# Patient Record
Sex: Male | Born: 1935 | Race: White | Hispanic: No | Smoking: Former smoker
Health system: Southern US, Community
[De-identification: ages and names within clinical notes are randomized; demographics above are authoritative.]

## PROBLEM LIST (undated history)

## (undated) DIAGNOSIS — D320 Benign neoplasm of cerebral meninges: Secondary | ICD-10-CM

## (undated) DIAGNOSIS — M1990 Unspecified osteoarthritis, unspecified site: Secondary | ICD-10-CM

## (undated) DIAGNOSIS — C679 Malignant neoplasm of bladder, unspecified: Secondary | ICD-10-CM

## (undated) DIAGNOSIS — J449 Chronic obstructive pulmonary disease, unspecified: Secondary | ICD-10-CM

## (undated) DIAGNOSIS — C61 Malignant neoplasm of prostate: Secondary | ICD-10-CM

## (undated) DIAGNOSIS — I10 Essential (primary) hypertension: Secondary | ICD-10-CM

## (undated) DIAGNOSIS — E782 Mixed hyperlipidemia: Secondary | ICD-10-CM

## (undated) DIAGNOSIS — G4730 Sleep apnea, unspecified: Secondary | ICD-10-CM

## (undated) DIAGNOSIS — D649 Anemia, unspecified: Secondary | ICD-10-CM

## (undated) DIAGNOSIS — K219 Gastro-esophageal reflux disease without esophagitis: Secondary | ICD-10-CM

## (undated) DIAGNOSIS — Z8701 Personal history of pneumonia (recurrent): Secondary | ICD-10-CM

## (undated) DIAGNOSIS — Z8709 Personal history of other diseases of the respiratory system: Secondary | ICD-10-CM

## (undated) DIAGNOSIS — K449 Diaphragmatic hernia without obstruction or gangrene: Secondary | ICD-10-CM

## (undated) DIAGNOSIS — N200 Calculus of kidney: Secondary | ICD-10-CM

## (undated) DIAGNOSIS — I2510 Atherosclerotic heart disease of native coronary artery without angina pectoris: Secondary | ICD-10-CM

## (undated) HISTORY — DX: Anemia, unspecified: D64.9

## (undated) HISTORY — PX: KNEE SURGERY: SHX244

## (undated) HISTORY — DX: Malignant neoplasm of prostate: C61

## (undated) HISTORY — DX: Personal history of other diseases of the respiratory system: Z87.09

## (undated) HISTORY — DX: Atherosclerotic heart disease of native coronary artery without angina pectoris: I25.10

## (undated) HISTORY — DX: Essential (primary) hypertension: I10

## (undated) HISTORY — DX: Mixed hyperlipidemia: E78.2

## (undated) HISTORY — DX: Calculus of kidney: N20.0

## (undated) HISTORY — DX: Diaphragmatic hernia without obstruction or gangrene: K44.9

## (undated) HISTORY — DX: Personal history of pneumonia (recurrent): Z87.01

---

## 2004-08-03 ENCOUNTER — Ambulatory Visit: Payer: Self-pay | Admitting: Cardiology

## 2006-03-07 ENCOUNTER — Ambulatory Visit: Payer: Self-pay | Admitting: Cardiology

## 2006-08-10 ENCOUNTER — Ambulatory Visit (HOSPITAL_COMMUNITY): Admission: RE | Admit: 2006-08-10 | Discharge: 2006-08-10 | Payer: Self-pay | Admitting: Urology

## 2006-08-18 ENCOUNTER — Ambulatory Visit (HOSPITAL_COMMUNITY): Admission: RE | Admit: 2006-08-18 | Discharge: 2006-08-18 | Payer: Self-pay | Admitting: Urology

## 2008-06-28 HISTORY — PX: PROSTATECTOMY: SHX69

## 2008-10-22 ENCOUNTER — Ambulatory Visit: Payer: Self-pay | Admitting: Cardiology

## 2008-12-26 ENCOUNTER — Ambulatory Visit: Admission: RE | Admit: 2008-12-26 | Discharge: 2009-03-24 | Payer: Self-pay | Admitting: Radiation Oncology

## 2010-01-07 ENCOUNTER — Ambulatory Visit: Payer: Self-pay | Admitting: Cardiology

## 2010-06-28 HISTORY — PX: KIDNEY STONE SURGERY: SHX686

## 2010-07-29 HISTORY — PX: CRANIOTOMY FOR STEREOTACTIC GUIDED SURGERY: SUR339

## 2010-08-18 ENCOUNTER — Other Ambulatory Visit (HOSPITAL_COMMUNITY): Payer: Self-pay | Admitting: Neurosurgery

## 2010-08-18 DIAGNOSIS — D320 Benign neoplasm of cerebral meninges: Secondary | ICD-10-CM

## 2010-08-21 ENCOUNTER — Ambulatory Visit (HOSPITAL_COMMUNITY)
Admission: RE | Admit: 2010-08-21 | Discharge: 2010-08-21 | Disposition: A | Discharge status: Home / Self Care | Payer: Medicare Other | Source: Ambulatory Visit | Attending: Neurosurgery | Admitting: Neurosurgery

## 2010-08-21 ENCOUNTER — Encounter (HOSPITAL_COMMUNITY)
Admission: RE | Admit: 2010-08-21 | Discharge: 2010-08-21 | Disposition: A | Discharge status: Home / Self Care | Payer: Medicare Other | Source: Ambulatory Visit | Attending: Neurosurgery | Admitting: Neurosurgery

## 2010-08-21 ENCOUNTER — Other Ambulatory Visit (HOSPITAL_COMMUNITY): Payer: Self-pay | Admitting: Neurosurgery

## 2010-08-21 DIAGNOSIS — Z01812 Encounter for preprocedural laboratory examination: Secondary | ICD-10-CM | POA: Insufficient documentation

## 2010-08-21 DIAGNOSIS — Z01811 Encounter for preprocedural respiratory examination: Secondary | ICD-10-CM

## 2010-08-21 DIAGNOSIS — Z77090 Contact with and (suspected) exposure to asbestos: Secondary | ICD-10-CM | POA: Insufficient documentation

## 2010-08-21 DIAGNOSIS — D496 Neoplasm of unspecified behavior of brain: Secondary | ICD-10-CM | POA: Insufficient documentation

## 2010-08-21 DIAGNOSIS — Z01818 Encounter for other preprocedural examination: Secondary | ICD-10-CM | POA: Insufficient documentation

## 2010-08-21 DIAGNOSIS — Z87891 Personal history of nicotine dependence: Secondary | ICD-10-CM | POA: Insufficient documentation

## 2010-08-21 DIAGNOSIS — Z01810 Encounter for preprocedural cardiovascular examination: Secondary | ICD-10-CM | POA: Insufficient documentation

## 2010-08-21 LAB — SURGICAL PCR SCREEN: MRSA, PCR: NEGATIVE

## 2010-08-21 LAB — BASIC METABOLIC PANEL
BUN: 19 mg/dL (ref 6–23)
Calcium: 9.7 mg/dL (ref 8.4–10.5)
Chloride: 103 mEq/L (ref 96–112)
Creatinine, Ser: 1.23 mg/dL (ref 0.4–1.5)
Glucose, Bld: 100 mg/dL — ABNORMAL HIGH (ref 70–99)
Potassium: 5 mEq/L (ref 3.5–5.1)

## 2010-08-21 LAB — TYPE AND SCREEN: Antibody Screen: NEGATIVE

## 2010-08-21 LAB — CBC
HCT: 40.5 % (ref 39.0–52.0)
RDW: 14.1 % (ref 11.5–15.5)

## 2010-08-21 LAB — ABO/RH: ABO/RH(D): A POS

## 2010-08-24 ENCOUNTER — Encounter (HOSPITAL_COMMUNITY): Payer: Self-pay

## 2010-08-24 ENCOUNTER — Ambulatory Visit (HOSPITAL_COMMUNITY)
Admission: RE | Admit: 2010-08-24 | Discharge: 2010-08-24 | Disposition: A | Discharge status: Home / Self Care | Payer: Medicare Other | Source: Ambulatory Visit | Attending: Neurosurgery | Admitting: Neurosurgery

## 2010-08-24 DIAGNOSIS — R51 Headache: Secondary | ICD-10-CM | POA: Insufficient documentation

## 2010-08-24 DIAGNOSIS — Z01818 Encounter for other preprocedural examination: Secondary | ICD-10-CM | POA: Insufficient documentation

## 2010-08-24 DIAGNOSIS — D320 Benign neoplasm of cerebral meninges: Secondary | ICD-10-CM | POA: Insufficient documentation

## 2010-08-24 DIAGNOSIS — R42 Dizziness and giddiness: Secondary | ICD-10-CM | POA: Insufficient documentation

## 2010-08-24 HISTORY — DX: Chronic obstructive pulmonary disease, unspecified: J44.9

## 2010-08-24 HISTORY — DX: Essential (primary) hypertension: I10

## 2010-08-24 MED ORDER — IOHEXOL 300 MG/ML  SOLN
100.0000 mL | Freq: Once | INTRAMUSCULAR | Status: AC | PRN
  Administered 2010-08-24: 100 mL via INTRAVENOUS

## 2010-08-26 ENCOUNTER — Inpatient Hospital Stay (HOSPITAL_COMMUNITY)
Admission: RE | Admit: 2010-08-26 | Discharge: 2010-08-27 | DRG: 027 | Disposition: A | Discharge status: Home / Self Care | Payer: Medicare Other | Source: Ambulatory Visit | Attending: Neurosurgery | Admitting: Neurosurgery

## 2010-08-26 ENCOUNTER — Other Ambulatory Visit: Payer: Self-pay | Admitting: Neurosurgery

## 2010-08-26 DIAGNOSIS — D320 Benign neoplasm of cerebral meninges: Principal | ICD-10-CM | POA: Diagnosis present

## 2010-08-26 DIAGNOSIS — Z880 Allergy status to penicillin: Secondary | ICD-10-CM

## 2010-09-17 NOTE — Op Note (Signed)
  NAME:  Harold York, Harold York              ACCOUNT NO.:  [id number]  MEDICAL RECORD NO.:  [id number]           PATIENT TYPE:  I  LOCATION:  3105                         FACILITY:  [facility]  PHYSICIAN:  Coletta Memos, M.D.     DATE OF BIRTH:  28-Feb-1936  DATE OF PROCEDURE:  08/26/2010 DATE OF DISCHARGE:                              OPERATIVE REPORT   PREOPERATIVE DIAGNOSIS:  Right frontoparietal meningioma.  POSTOPERATIVE DIAGNOSIS:  Right frontal meningioma.  COMPLICATIONS:  None.  SURGEON:  Coletta Memos, MD  ASSISTANT:  Clydene Fake, MD  PROCEDURE:  Right stereotactic-guided craniotomy for meningioma resection, frontoparietal.  COMPLICATIONS:  None.  ANESTHESIA:  General endotracheal.  INDICATIONS:  [name] is a 76 year old with very small meningioma.  He has some growth over followup scans, therefore he wanted this removed.  I agreed to remove the tumor.  OPERATIVE NOTE:  [name] had undergone a sterile CT scan.  After his coordinate and planning had been done on the StealthStation, he was taken to the operating room.  He was intubated and placed under general anesthetic without difficulty.  A Foley catheter was placed under sterile conditions.  Arterial line was placed also under sterile conditions.  His head was fixated after adequate anesthesia in a Mayfield three pin head holder to approximately 70 pounds of pressure. His head was then shaved and I then localized his head to the Stealth system.  After localization and planning of the procedure, his head was then prepped and draped in a sterile fashion.  I again used the Stealth to plan my scalp incision.  I opened the incision, which was semicircular, so that I can keep it behind the hairline and I reflected that anteriorly.  I then split just the very top portion of the temporalis muscle.  I placed 2 bur holes in the same coronal plane, one more medially and the other more lateral.  I connected the bur  holes with craniotome.  I elevated the skull flap.  I then again with the Stealth probe used that to make sure I was over the correct location and I was.  I then opened the dura with Metzenbaum scissors.  After cutting the dura, I was able to retract that and tumor was readily visible.  I then removed what was this very small tumor and surrounding meninges.  I then placed a dural patch graft using Bovine pericardium.  The dura and tumor was sent to Pathology for identification.  I then with Dr. Doreen Beam assistance replaced the skull flap.  We then reapproximated the scalp using Vicryl sutures to reapproximate the temporalis fascia and then the subgaleal layer.  I used staples for the scalp edges.  I placed a sterile dressing and removed them from the three pin Mayfield head holder.  He tolerated the procedure well.          ______________________________ Coletta Memos, M.D.     KC/MEDQ  D:  08/26/2010  T:  08/27/2010  Job:  [id number]  Electronically Signed by Coletta Memos M.D. on 09/17/2010 02:32:46 PM

## 2010-09-17 NOTE — Discharge Summary (Signed)
  NAME:  Harold York, Harold York              ACCOUNT NO.:  [id number]  MEDICAL RECORD NO.:  [id number]           PATIENT TYPE:  I  LOCATION:  3105                         FACILITY:  [facility]  PHYSICIAN:  Coletta Memos, M.D.     DATE OF BIRTH:  16-May-1936  DATE OF ADMISSION:  08/26/2010 DATE OF DISCHARGE:  08/27/2010                              DISCHARGE SUMMARY   ADMITTING DIAGNOSIS:  Right frontal meningioma.  DISCHARGE DIAGNOSIS:  Right frontal meningioma.  PROCEDURE:  Right frontal craniotomy for meningioma resection.  COMPLICATIONS:  None.  Craniotomy was done with stereotactic guidance.  DISCHARGE STATUS:  Alive and well.  DESTINATION:  Home.  MEDICATIONS:  Only new medication will be Vicodin for pain.  The patient is alert, oriented x4 and answering all questions appropriately at discharge.  Pupils are equal, round, and reactive to light.  Dressing is clean and dry.  He is alert and oriented x4.  Speech is clear and 5/5 strength in upper and lower extremities.  No drift.  He will be discharged home today.  Return appointment to see him in approximately 1 week for staple removal.          ______________________________ Coletta Memos, M.D.     KC/MEDQ  D:  08/27/2010  T:  08/28/2010  Job:  [id number]  Electronically Signed by Coletta Memos M.D. on 09/17/2010 02:32:39 PM

## 2010-11-13 NOTE — H&P (Signed)
NAME:  Harold York, Harold York              ACCOUNT NO.:  [id number]   MEDICAL RECORD NO.:  [id number]           PATIENT TYPE:  AMB   LOCATION:                                FACILITY:  [facility]   PHYSICIAN:  Ky Barban, M.D.DATE OF BIRTH:  03-Jul-1935   DATE OF ADMISSION:  DATE OF DISCHARGE:  LH                              HISTORY & PHYSICAL   CHIEF COMPLAINT:  Left renal calculus.   HISTORY:  This 75 year old gentleman was admitted to [facility]  on July 27, 2006, with left renal colic.  A workup showed that he has  a stone 5 mm in size, in the left ureteropelvic junction, so he  underwent surgery with a double-J stent.  He is being brought as an  outpatient to undergo a ESL.  The procedure or need for additional  procedure were discussed.  He understands and wants me to go ahead and  proceed.   PAST MEDICAL HISTORY:  1. To years ago he had a transurethral resection of the prostate for      benign prostatic hypertrophy, but he was subsequently found to have      Stage A, low-grade Gleason score 2 adenocarcinoma of the prostate.      It was advised simply observation.  2. He has severe chronic obstructive pulmonary disease.  3. 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  4. He underwent a __________ bladder neck.  Again this time I find      that he has bladder neck stenosis with fibrosis and calcium deposit      on the bladder neck.  I had to dissect again this time.   ALLERGIES:  PENICILLIN AND LIDOCAINE.   REVIEW OF SYSTEMS:  Denies chest pain, orthopnea, PND, nausea or  vomiting.   SOCIAL HISTORY:  He does not smoke or drink.   REVIEW OF SYSTEMS:  As mentioned above.   PHYSICAL EXAMINATION:  GENERAL:  Moderately obese, not in acute  distress.  VITAL SIGNS:  Blood pressure 130/80, temperature normal.  CNS:  Negative.  HEENT/NECK:  Negative.  CHEST:  Clear.  HEART:  Regular sinus rhythm.  No murmur.  ABDOMEN:   Soft, flat.  Liver and spleen and kidneys are not palpable.  GENITOURINARY:  External genitalia:  Circumcised meatus.  Testicles are  normal.  RECTAL:  Normal sphincter tone.  No rectal mass.  Prostate 1.5+, smooth  and firm.   IMPRESSION:  1. Left renal calculus.  2. Chronic obstructive pulmonary disease.  3. Stage A prostate cancer.   PLAN:  ESL for left renal calculus.      Ky Barban, M.D.  Electronically Signed     MIJ/MEDQ  D:  08/09/2006  T:  08/09/2006  Job:  [id number]

## 2011-06-29 HISTORY — PX: CATARACT EXTRACTION W/ INTRAOCULAR LENS  IMPLANT, BILATERAL: SHX1307

## 2011-07-11 DIAGNOSIS — N200 Calculus of kidney: Secondary | ICD-10-CM | POA: Diagnosis not present

## 2011-07-11 DIAGNOSIS — R319 Hematuria, unspecified: Secondary | ICD-10-CM | POA: Diagnosis not present

## 2011-07-11 DIAGNOSIS — R339 Retention of urine, unspecified: Secondary | ICD-10-CM | POA: Diagnosis not present

## 2011-07-11 DIAGNOSIS — Z8546 Personal history of malignant neoplasm of prostate: Secondary | ICD-10-CM | POA: Diagnosis not present

## 2011-07-11 DIAGNOSIS — Z79899 Other long term (current) drug therapy: Secondary | ICD-10-CM | POA: Diagnosis not present

## 2011-07-11 DIAGNOSIS — K7689 Other specified diseases of liver: Secondary | ICD-10-CM | POA: Diagnosis not present

## 2011-07-11 DIAGNOSIS — Z7982 Long term (current) use of aspirin: Secondary | ICD-10-CM | POA: Diagnosis not present

## 2011-07-11 DIAGNOSIS — R160 Hepatomegaly, not elsewhere classified: Secondary | ICD-10-CM | POA: Diagnosis not present

## 2011-07-12 DIAGNOSIS — R319 Hematuria, unspecified: Secondary | ICD-10-CM | POA: Diagnosis not present

## 2011-07-12 DIAGNOSIS — C61 Malignant neoplasm of prostate: Secondary | ICD-10-CM | POA: Diagnosis not present

## 2011-07-12 DIAGNOSIS — R6889 Other general symptoms and signs: Secondary | ICD-10-CM | POA: Diagnosis not present

## 2011-07-12 DIAGNOSIS — N390 Urinary tract infection, site not specified: Secondary | ICD-10-CM | POA: Diagnosis not present

## 2011-07-13 DIAGNOSIS — C61 Malignant neoplasm of prostate: Secondary | ICD-10-CM | POA: Diagnosis not present

## 2011-07-13 DIAGNOSIS — R310 Gross hematuria: Secondary | ICD-10-CM | POA: Diagnosis not present

## 2011-07-15 DIAGNOSIS — J438 Other emphysema: Secondary | ICD-10-CM | POA: Diagnosis not present

## 2011-07-15 DIAGNOSIS — N200 Calculus of kidney: Secondary | ICD-10-CM | POA: Diagnosis not present

## 2011-07-15 DIAGNOSIS — Q618 Other cystic kidney diseases: Secondary | ICD-10-CM | POA: Diagnosis not present

## 2011-07-15 DIAGNOSIS — N281 Cyst of kidney, acquired: Secondary | ICD-10-CM | POA: Diagnosis not present

## 2011-07-15 DIAGNOSIS — I10 Essential (primary) hypertension: Secondary | ICD-10-CM | POA: Diagnosis not present

## 2011-07-15 DIAGNOSIS — E782 Mixed hyperlipidemia: Secondary | ICD-10-CM | POA: Diagnosis not present

## 2011-07-15 DIAGNOSIS — R310 Gross hematuria: Secondary | ICD-10-CM | POA: Diagnosis not present

## 2011-07-15 DIAGNOSIS — Z8546 Personal history of malignant neoplasm of prostate: Secondary | ICD-10-CM | POA: Diagnosis not present

## 2011-07-15 DIAGNOSIS — M159 Polyosteoarthritis, unspecified: Secondary | ICD-10-CM | POA: Diagnosis not present

## 2011-07-15 DIAGNOSIS — C61 Malignant neoplasm of prostate: Secondary | ICD-10-CM | POA: Diagnosis not present

## 2011-07-27 DIAGNOSIS — C61 Malignant neoplasm of prostate: Secondary | ICD-10-CM | POA: Diagnosis not present

## 2011-09-21 DIAGNOSIS — C61 Malignant neoplasm of prostate: Secondary | ICD-10-CM | POA: Diagnosis not present

## 2011-09-27 DIAGNOSIS — C61 Malignant neoplasm of prostate: Secondary | ICD-10-CM | POA: Diagnosis not present

## 2011-10-21 DIAGNOSIS — I10 Essential (primary) hypertension: Secondary | ICD-10-CM | POA: Diagnosis not present

## 2012-01-20 DIAGNOSIS — I10 Essential (primary) hypertension: Secondary | ICD-10-CM | POA: Diagnosis not present

## 2012-01-20 DIAGNOSIS — E782 Mixed hyperlipidemia: Secondary | ICD-10-CM | POA: Diagnosis not present

## 2012-02-05 DIAGNOSIS — Z86011 Personal history of benign neoplasm of the brain: Secondary | ICD-10-CM | POA: Diagnosis not present

## 2012-02-05 DIAGNOSIS — Z8546 Personal history of malignant neoplasm of prostate: Secondary | ICD-10-CM | POA: Diagnosis not present

## 2012-02-05 DIAGNOSIS — R109 Unspecified abdominal pain: Secondary | ICD-10-CM | POA: Diagnosis not present

## 2012-02-05 DIAGNOSIS — R339 Retention of urine, unspecified: Secondary | ICD-10-CM | POA: Diagnosis not present

## 2012-02-05 DIAGNOSIS — Z79899 Other long term (current) drug therapy: Secondary | ICD-10-CM | POA: Diagnosis not present

## 2012-02-05 DIAGNOSIS — R319 Hematuria, unspecified: Secondary | ICD-10-CM | POA: Diagnosis not present

## 2012-02-05 DIAGNOSIS — Z7982 Long term (current) use of aspirin: Secondary | ICD-10-CM | POA: Diagnosis not present

## 2012-02-07 DIAGNOSIS — Z79899 Other long term (current) drug therapy: Secondary | ICD-10-CM | POA: Diagnosis not present

## 2012-02-07 DIAGNOSIS — R339 Retention of urine, unspecified: Secondary | ICD-10-CM | POA: Diagnosis not present

## 2012-02-07 DIAGNOSIS — T83091A Other mechanical complication of indwelling urethral catheter, initial encounter: Secondary | ICD-10-CM | POA: Diagnosis not present

## 2012-02-07 DIAGNOSIS — Z466 Encounter for fitting and adjustment of urinary device: Secondary | ICD-10-CM | POA: Diagnosis not present

## 2012-02-07 DIAGNOSIS — Z8546 Personal history of malignant neoplasm of prostate: Secondary | ICD-10-CM | POA: Diagnosis not present

## 2012-02-08 DIAGNOSIS — R310 Gross hematuria: Secondary | ICD-10-CM | POA: Diagnosis not present

## 2012-02-14 DIAGNOSIS — R310 Gross hematuria: Secondary | ICD-10-CM | POA: Diagnosis not present

## 2012-02-14 DIAGNOSIS — R319 Hematuria, unspecified: Secondary | ICD-10-CM | POA: Diagnosis not present

## 2012-02-14 DIAGNOSIS — N281 Cyst of kidney, acquired: Secondary | ICD-10-CM | POA: Diagnosis not present

## 2012-02-17 DIAGNOSIS — D494 Neoplasm of unspecified behavior of bladder: Secondary | ICD-10-CM | POA: Diagnosis not present

## 2012-02-18 DIAGNOSIS — R339 Retention of urine, unspecified: Secondary | ICD-10-CM | POA: Diagnosis not present

## 2012-02-18 DIAGNOSIS — R109 Unspecified abdominal pain: Secondary | ICD-10-CM | POA: Diagnosis not present

## 2012-02-18 DIAGNOSIS — Z79899 Other long term (current) drug therapy: Secondary | ICD-10-CM | POA: Diagnosis not present

## 2012-02-18 DIAGNOSIS — M549 Dorsalgia, unspecified: Secondary | ICD-10-CM | POA: Diagnosis not present

## 2012-02-18 DIAGNOSIS — Z8546 Personal history of malignant neoplasm of prostate: Secondary | ICD-10-CM | POA: Diagnosis not present

## 2012-02-24 DIAGNOSIS — E785 Hyperlipidemia, unspecified: Secondary | ICD-10-CM | POA: Diagnosis not present

## 2012-02-24 DIAGNOSIS — J449 Chronic obstructive pulmonary disease, unspecified: Secondary | ICD-10-CM | POA: Diagnosis not present

## 2012-02-24 DIAGNOSIS — I10 Essential (primary) hypertension: Secondary | ICD-10-CM | POA: Diagnosis not present

## 2012-02-24 DIAGNOSIS — C61 Malignant neoplasm of prostate: Secondary | ICD-10-CM | POA: Diagnosis not present

## 2012-02-24 DIAGNOSIS — C674 Malignant neoplasm of posterior wall of bladder: Secondary | ICD-10-CM | POA: Diagnosis not present

## 2012-02-24 DIAGNOSIS — C801 Malignant (primary) neoplasm, unspecified: Secondary | ICD-10-CM | POA: Diagnosis not present

## 2012-02-24 DIAGNOSIS — Z79899 Other long term (current) drug therapy: Secondary | ICD-10-CM | POA: Diagnosis not present

## 2012-02-24 DIAGNOSIS — Z801 Family history of malignant neoplasm of trachea, bronchus and lung: Secondary | ICD-10-CM | POA: Diagnosis not present

## 2012-02-24 DIAGNOSIS — K449 Diaphragmatic hernia without obstruction or gangrene: Secondary | ICD-10-CM | POA: Diagnosis not present

## 2012-02-24 DIAGNOSIS — D494 Neoplasm of unspecified behavior of bladder: Secondary | ICD-10-CM | POA: Diagnosis not present

## 2012-02-24 DIAGNOSIS — Z87891 Personal history of nicotine dependence: Secondary | ICD-10-CM | POA: Diagnosis not present

## 2012-02-24 DIAGNOSIS — Z800 Family history of malignant neoplasm of digestive organs: Secondary | ICD-10-CM | POA: Diagnosis not present

## 2012-02-24 DIAGNOSIS — M1990 Unspecified osteoarthritis, unspecified site: Secondary | ICD-10-CM | POA: Diagnosis not present

## 2012-02-25 DIAGNOSIS — K449 Diaphragmatic hernia without obstruction or gangrene: Secondary | ICD-10-CM | POA: Diagnosis not present

## 2012-02-25 DIAGNOSIS — C61 Malignant neoplasm of prostate: Secondary | ICD-10-CM | POA: Diagnosis not present

## 2012-02-25 DIAGNOSIS — Z79899 Other long term (current) drug therapy: Secondary | ICD-10-CM | POA: Diagnosis not present

## 2012-02-25 DIAGNOSIS — I10 Essential (primary) hypertension: Secondary | ICD-10-CM | POA: Diagnosis not present

## 2012-02-25 DIAGNOSIS — J449 Chronic obstructive pulmonary disease, unspecified: Secondary | ICD-10-CM | POA: Diagnosis not present

## 2012-02-25 DIAGNOSIS — C674 Malignant neoplasm of posterior wall of bladder: Secondary | ICD-10-CM | POA: Diagnosis not present

## 2012-02-25 DIAGNOSIS — C679 Malignant neoplasm of bladder, unspecified: Secondary | ICD-10-CM | POA: Diagnosis not present

## 2012-03-29 DIAGNOSIS — C679 Malignant neoplasm of bladder, unspecified: Secondary | ICD-10-CM | POA: Diagnosis not present

## 2012-04-21 DIAGNOSIS — I10 Essential (primary) hypertension: Secondary | ICD-10-CM | POA: Diagnosis not present

## 2012-04-21 DIAGNOSIS — L570 Actinic keratosis: Secondary | ICD-10-CM | POA: Diagnosis not present

## 2012-07-24 DIAGNOSIS — I10 Essential (primary) hypertension: Secondary | ICD-10-CM | POA: Diagnosis not present

## 2012-07-24 DIAGNOSIS — E782 Mixed hyperlipidemia: Secondary | ICD-10-CM | POA: Diagnosis not present

## 2012-08-22 DIAGNOSIS — C61 Malignant neoplasm of prostate: Secondary | ICD-10-CM | POA: Diagnosis not present

## 2012-10-09 DIAGNOSIS — C61 Malignant neoplasm of prostate: Secondary | ICD-10-CM | POA: Diagnosis not present

## 2012-10-17 DIAGNOSIS — C679 Malignant neoplasm of bladder, unspecified: Secondary | ICD-10-CM | POA: Diagnosis not present

## 2012-10-17 DIAGNOSIS — E669 Obesity, unspecified: Secondary | ICD-10-CM | POA: Diagnosis not present

## 2012-10-17 DIAGNOSIS — Z800 Family history of malignant neoplasm of digestive organs: Secondary | ICD-10-CM | POA: Diagnosis not present

## 2012-10-17 DIAGNOSIS — K219 Gastro-esophageal reflux disease without esophagitis: Secondary | ICD-10-CM | POA: Diagnosis not present

## 2012-10-17 DIAGNOSIS — I10 Essential (primary) hypertension: Secondary | ICD-10-CM | POA: Diagnosis not present

## 2012-10-17 DIAGNOSIS — Z801 Family history of malignant neoplasm of trachea, bronchus and lung: Secondary | ICD-10-CM | POA: Diagnosis not present

## 2012-10-17 DIAGNOSIS — E785 Hyperlipidemia, unspecified: Secondary | ICD-10-CM | POA: Diagnosis not present

## 2012-10-17 DIAGNOSIS — Z880 Allergy status to penicillin: Secondary | ICD-10-CM | POA: Diagnosis not present

## 2012-10-17 DIAGNOSIS — Z87891 Personal history of nicotine dependence: Secondary | ICD-10-CM | POA: Diagnosis not present

## 2012-10-17 DIAGNOSIS — K449 Diaphragmatic hernia without obstruction or gangrene: Secondary | ICD-10-CM | POA: Diagnosis not present

## 2012-10-17 DIAGNOSIS — Z87442 Personal history of urinary calculi: Secondary | ICD-10-CM | POA: Diagnosis not present

## 2012-10-17 DIAGNOSIS — J449 Chronic obstructive pulmonary disease, unspecified: Secondary | ICD-10-CM | POA: Diagnosis not present

## 2012-10-17 DIAGNOSIS — C61 Malignant neoplasm of prostate: Secondary | ICD-10-CM | POA: Diagnosis not present

## 2012-10-17 DIAGNOSIS — M1990 Unspecified osteoarthritis, unspecified site: Secondary | ICD-10-CM | POA: Diagnosis not present

## 2012-10-17 DIAGNOSIS — Z79899 Other long term (current) drug therapy: Secondary | ICD-10-CM | POA: Diagnosis not present

## 2012-10-18 DIAGNOSIS — N390 Urinary tract infection, site not specified: Secondary | ICD-10-CM | POA: Diagnosis not present

## 2012-10-19 DIAGNOSIS — K449 Diaphragmatic hernia without obstruction or gangrene: Secondary | ICD-10-CM | POA: Diagnosis not present

## 2012-10-19 DIAGNOSIS — C801 Malignant (primary) neoplasm, unspecified: Secondary | ICD-10-CM | POA: Diagnosis not present

## 2012-10-19 DIAGNOSIS — Z87442 Personal history of urinary calculi: Secondary | ICD-10-CM | POA: Diagnosis not present

## 2012-10-19 DIAGNOSIS — E785 Hyperlipidemia, unspecified: Secondary | ICD-10-CM | POA: Diagnosis not present

## 2012-10-19 DIAGNOSIS — C672 Malignant neoplasm of lateral wall of bladder: Secondary | ICD-10-CM | POA: Diagnosis not present

## 2012-10-19 DIAGNOSIS — M1990 Unspecified osteoarthritis, unspecified site: Secondary | ICD-10-CM | POA: Diagnosis not present

## 2012-10-19 DIAGNOSIS — D494 Neoplasm of unspecified behavior of bladder: Secondary | ICD-10-CM | POA: Diagnosis not present

## 2012-10-19 DIAGNOSIS — J449 Chronic obstructive pulmonary disease, unspecified: Secondary | ICD-10-CM | POA: Diagnosis not present

## 2012-10-19 DIAGNOSIS — C61 Malignant neoplasm of prostate: Secondary | ICD-10-CM | POA: Diagnosis not present

## 2012-10-19 DIAGNOSIS — I10 Essential (primary) hypertension: Secondary | ICD-10-CM | POA: Diagnosis not present

## 2012-10-19 DIAGNOSIS — C679 Malignant neoplasm of bladder, unspecified: Secondary | ICD-10-CM | POA: Diagnosis not present

## 2012-10-20 DIAGNOSIS — C679 Malignant neoplasm of bladder, unspecified: Secondary | ICD-10-CM | POA: Diagnosis not present

## 2012-10-20 DIAGNOSIS — Z87442 Personal history of urinary calculi: Secondary | ICD-10-CM | POA: Diagnosis not present

## 2012-10-20 DIAGNOSIS — I10 Essential (primary) hypertension: Secondary | ICD-10-CM | POA: Diagnosis not present

## 2012-10-20 DIAGNOSIS — J4489 Other specified chronic obstructive pulmonary disease: Secondary | ICD-10-CM | POA: Diagnosis not present

## 2012-10-20 DIAGNOSIS — C61 Malignant neoplasm of prostate: Secondary | ICD-10-CM | POA: Diagnosis not present

## 2012-10-20 DIAGNOSIS — J449 Chronic obstructive pulmonary disease, unspecified: Secondary | ICD-10-CM | POA: Diagnosis not present

## 2012-10-20 DIAGNOSIS — K449 Diaphragmatic hernia without obstruction or gangrene: Secondary | ICD-10-CM | POA: Diagnosis not present

## 2012-11-01 DIAGNOSIS — I10 Essential (primary) hypertension: Secondary | ICD-10-CM | POA: Diagnosis not present

## 2012-11-01 DIAGNOSIS — N12 Tubulo-interstitial nephritis, not specified as acute or chronic: Secondary | ICD-10-CM | POA: Diagnosis not present

## 2012-11-01 DIAGNOSIS — Z801 Family history of malignant neoplasm of trachea, bronchus and lung: Secondary | ICD-10-CM | POA: Diagnosis not present

## 2012-11-01 DIAGNOSIS — Z9889 Other specified postprocedural states: Secondary | ICD-10-CM | POA: Diagnosis not present

## 2012-11-01 DIAGNOSIS — M1990 Unspecified osteoarthritis, unspecified site: Secondary | ICD-10-CM | POA: Diagnosis present

## 2012-11-01 DIAGNOSIS — J449 Chronic obstructive pulmonary disease, unspecified: Secondary | ICD-10-CM | POA: Diagnosis not present

## 2012-11-01 DIAGNOSIS — Z880 Allergy status to penicillin: Secondary | ICD-10-CM | POA: Diagnosis not present

## 2012-11-01 DIAGNOSIS — E785 Hyperlipidemia, unspecified: Secondary | ICD-10-CM | POA: Diagnosis present

## 2012-11-01 DIAGNOSIS — K5730 Diverticulosis of large intestine without perforation or abscess without bleeding: Secondary | ICD-10-CM | POA: Diagnosis not present

## 2012-11-01 DIAGNOSIS — N10 Acute tubulo-interstitial nephritis: Secondary | ICD-10-CM | POA: Diagnosis not present

## 2012-11-01 DIAGNOSIS — Z888 Allergy status to other drugs, medicaments and biological substances status: Secondary | ICD-10-CM | POA: Diagnosis not present

## 2012-11-01 DIAGNOSIS — Z87891 Personal history of nicotine dependence: Secondary | ICD-10-CM | POA: Diagnosis not present

## 2012-11-01 DIAGNOSIS — Z800 Family history of malignant neoplasm of digestive organs: Secondary | ICD-10-CM | POA: Diagnosis not present

## 2012-11-01 DIAGNOSIS — R110 Nausea: Secondary | ICD-10-CM | POA: Diagnosis not present

## 2012-11-01 DIAGNOSIS — Z8551 Personal history of malignant neoplasm of bladder: Secondary | ICD-10-CM | POA: Diagnosis not present

## 2012-11-01 DIAGNOSIS — Z79899 Other long term (current) drug therapy: Secondary | ICD-10-CM | POA: Diagnosis not present

## 2012-11-01 DIAGNOSIS — K449 Diaphragmatic hernia without obstruction or gangrene: Secondary | ICD-10-CM | POA: Diagnosis not present

## 2012-11-06 DIAGNOSIS — C672 Malignant neoplasm of lateral wall of bladder: Secondary | ICD-10-CM | POA: Diagnosis not present

## 2012-11-09 DIAGNOSIS — N3000 Acute cystitis without hematuria: Secondary | ICD-10-CM | POA: Diagnosis not present

## 2012-11-09 DIAGNOSIS — Z0000 Encounter for general adult medical examination without abnormal findings: Secondary | ICD-10-CM | POA: Diagnosis not present

## 2013-03-12 ENCOUNTER — Emergency Department (HOSPITAL_COMMUNITY): Payer: Medicare Other

## 2013-03-12 ENCOUNTER — Inpatient Hospital Stay (HOSPITAL_COMMUNITY)
Admission: EM | Admit: 2013-03-12 | Discharge: 2013-03-13 | DRG: 312 | Disposition: A | Discharge status: Home / Self Care | Payer: Medicare Other | Attending: Internal Medicine | Admitting: Internal Medicine

## 2013-03-12 ENCOUNTER — Encounter (HOSPITAL_COMMUNITY): Payer: Self-pay

## 2013-03-12 DIAGNOSIS — Z9079 Acquired absence of other genital organ(s): Secondary | ICD-10-CM | POA: Diagnosis not present

## 2013-03-12 DIAGNOSIS — N289 Disorder of kidney and ureter, unspecified: Secondary | ICD-10-CM | POA: Diagnosis present

## 2013-03-12 DIAGNOSIS — Z923 Personal history of irradiation: Secondary | ICD-10-CM | POA: Diagnosis not present

## 2013-03-12 DIAGNOSIS — R0902 Hypoxemia: Secondary | ICD-10-CM | POA: Diagnosis not present

## 2013-03-12 DIAGNOSIS — Z85841 Personal history of malignant neoplasm of brain: Secondary | ICD-10-CM

## 2013-03-12 DIAGNOSIS — J441 Chronic obstructive pulmonary disease with (acute) exacerbation: Secondary | ICD-10-CM | POA: Diagnosis present

## 2013-03-12 DIAGNOSIS — J438 Other emphysema: Secondary | ICD-10-CM | POA: Diagnosis not present

## 2013-03-12 DIAGNOSIS — M25519 Pain in unspecified shoulder: Secondary | ICD-10-CM | POA: Diagnosis not present

## 2013-03-12 DIAGNOSIS — R55 Syncope and collapse: Secondary | ICD-10-CM | POA: Diagnosis not present

## 2013-03-12 DIAGNOSIS — Z79899 Other long term (current) drug therapy: Secondary | ICD-10-CM

## 2013-03-12 DIAGNOSIS — I10 Essential (primary) hypertension: Secondary | ICD-10-CM | POA: Diagnosis present

## 2013-03-12 DIAGNOSIS — I7090 Unspecified atherosclerosis: Secondary | ICD-10-CM | POA: Diagnosis not present

## 2013-03-12 DIAGNOSIS — S298XXA Other specified injuries of thorax, initial encounter: Secondary | ICD-10-CM | POA: Diagnosis not present

## 2013-03-12 DIAGNOSIS — R404 Transient alteration of awareness: Secondary | ICD-10-CM | POA: Diagnosis not present

## 2013-03-12 DIAGNOSIS — J449 Chronic obstructive pulmonary disease, unspecified: Secondary | ICD-10-CM | POA: Diagnosis present

## 2013-03-12 DIAGNOSIS — S4980XA Other specified injuries of shoulder and upper arm, unspecified arm, initial encounter: Secondary | ICD-10-CM | POA: Diagnosis not present

## 2013-03-12 DIAGNOSIS — T148XXA Other injury of unspecified body region, initial encounter: Secondary | ICD-10-CM | POA: Diagnosis not present

## 2013-03-12 DIAGNOSIS — R51 Headache: Secondary | ICD-10-CM | POA: Diagnosis present

## 2013-03-12 DIAGNOSIS — Z8546 Personal history of malignant neoplasm of prostate: Secondary | ICD-10-CM

## 2013-03-12 DIAGNOSIS — M47812 Spondylosis without myelopathy or radiculopathy, cervical region: Secondary | ICD-10-CM | POA: Diagnosis not present

## 2013-03-12 HISTORY — DX: Benign neoplasm of cerebral meninges: D32.0

## 2013-03-12 LAB — CBC WITH DIFFERENTIAL/PLATELET
Basophils Relative: 1 % (ref 0–1)
Eosinophils Absolute: 0.1 10*3/uL (ref 0.0–0.7)
HCT: 37.8 % — ABNORMAL LOW (ref 39.0–52.0)
Hemoglobin: 12.2 g/dL — ABNORMAL LOW (ref 13.0–17.0)
Lymphocytes Relative: 20 % (ref 12–46)
Lymphs Abs: 1.1 10*3/uL (ref 0.7–4.0)
MCH: 28.3 pg (ref 26.0–34.0)
MCHC: 32.3 g/dL (ref 30.0–36.0)
Monocytes Absolute: 0.4 10*3/uL (ref 0.1–1.0)
Monocytes Relative: 7 % (ref 3–12)
RDW: 15.1 % (ref 11.5–15.5)
WBC: 5.3 10*3/uL (ref 4.0–10.5)

## 2013-03-12 LAB — BASIC METABOLIC PANEL
CO2: 28 mEq/L (ref 19–32)
Calcium: 9 mg/dL (ref 8.4–10.5)
Chloride: 104 mEq/L (ref 96–112)
Creatinine, Ser: 1.19 mg/dL (ref 0.50–1.35)
GFR calc Af Amer: 67 mL/min — ABNORMAL LOW (ref >90)
Glucose, Bld: 119 mg/dL — ABNORMAL HIGH (ref 70–99)
Potassium: 4 mEq/L (ref 3.5–5.1)
Sodium: 139 mEq/L (ref 135–145)

## 2013-03-12 LAB — GLUCOSE, CAPILLARY

## 2013-03-12 MED ORDER — ONDANSETRON HCL 4 MG/2ML IJ SOLN
4.0000 mg | Freq: Once | INTRAMUSCULAR | Status: AC
  Administered 2013-03-12: 4 mg via INTRAVENOUS
  Filled 2013-03-12: qty 2

## 2013-03-12 MED ORDER — CLONIDINE HCL 0.1 MG PO TABS
0.1000 mg | ORAL_TABLET | Freq: Once | ORAL | Status: AC
  Administered 2013-03-12: 0.1 mg via ORAL
  Filled 2013-03-12: qty 1

## 2013-03-12 MED ORDER — SODIUM CHLORIDE 0.9 % IV BOLUS (SEPSIS)
1000.0000 mL | Freq: Once | INTRAVENOUS | Status: AC
  Administered 2013-03-12: 1000 mL via INTRAVENOUS

## 2013-03-12 MED ORDER — MORPHINE SULFATE 2 MG/ML IJ SOLN
2.0000 mg | Freq: Once | INTRAMUSCULAR | Status: AC
  Administered 2013-03-12: 2 mg via INTRAVENOUS
  Filled 2013-03-12: qty 1

## 2013-03-12 NOTE — ED Provider Notes (Addendum)
CSN: [id number]     Arrival date & time 03/12/13  1613 History  This chart was scribed for Donnetta Hutching, MD by Arlan Organ, ED Scribe. This patient was seen in room APA02/APA02 and the patient's care was started 4:56 PM.    Chief Complaint  Patient presents with  . Loss of Consciousness   The history is provided by the patient and the EMS personnel. No language interpreter was used.   HPI Comments: NIZAR CUTLER is a 77 y.o. male brought in by ambulance, who presents to the Emergency Department complaining of a syncopal episode that occurred today. Pt states he was standing up to get off of his lawnmower,  prior to his syncopal episode. Per nursing note, pt was unable to call 911. The mail carrier called for him. Per EMS, pt was laying on the ground for 1 hour prior to arrival. Pt states he did hit his head at the time of the fall. He now complains of constant, unchanged bilateral shoulder and neck pain. Pt reports a history of 2 brain tumors in 2012. He states he had radiation with no results. Pt was brought in on a LSB, but no C collar. Pt denies back pain, hip pain, abdominal pain. Pt denies diabetes.  Past Medical History  Diagnosis Date  . Hypertension   . Asthma   . COPD (chronic obstructive pulmonary disease)   . Cancer     prostate  . Cerebral meningioma   . Renal disorder     kidney stones   Past Surgical History  Procedure Laterality Date  . Brain surgery    . Prostatectomy    . Kidney stone surgery     No family history on file. History  Substance Use Topics  . Smoking status: Never Smoker   . Smokeless tobacco: Not on file  . Alcohol Use: No    Review of Systems A complete 10 system review of systems was obtained and all systems are negative except as noted in the HPI and PMH.   Allergies  Lidocaine and Penicillins  Home Medications  No current outpatient prescriptions on file. BP 192/99  Pulse 67  Temp(Src) 98.2 F (36.8 C) (Oral)  Resp 22  Ht 5\' 10"   (1.778 m)  Wt 250 lb (113.399 kg)  BMI 35.87 kg/m2  SpO2 93% Physical Exam  Nursing note and vitals reviewed. Constitutional: He is oriented to person, place, and time. He appears well-developed and well-nourished.  HENT:  Head: Normocephalic and atraumatic.  Tenderness to palpation over right temporal region.   Eyes: Conjunctivae and EOM are normal. Pupils are equal, round, and reactive to light.  Neck: Normal range of motion. Neck supple.  Cardiovascular: Normal rate, regular rhythm and normal heart sounds.   Pulmonary/Chest: Effort normal and breath sounds normal.  Abdominal: Soft. Bowel sounds are normal.  Musculoskeletal: Normal range of motion. He exhibits tenderness.  Tenderness to palpation over mid and lateral shaft of right clavicle Tenderness to palpation to the cervical spine.   Neurological: He is alert and oriented to person, place, and time.  Skin: Skin is warm and dry.  Psychiatric: He has a normal mood and affect.    ED Course  Procedures (including critical care time)  DIAGNOSTIC STUDIES: Oxygen Saturation is 93% on RA, Adequate by my interpretation.    COORDINATION OF CARE: 4:56 PM-Will order IV fluids, EKG, lab work, CT of cervical spine and head, and x-rays of the right clavicle. Discussed treatment plan with pt at  bedside and pt agreed to plan.     Labs Review  Imaging Review No results found. Results for orders placed during the hospital encounter of 03/12/13  GLUCOSE, CAPILLARY      Result Value Range   Glucose-Capillary 108 (*) 70 - 99 mg/dL   Comment 1 Documented in Chart     Comment 2 Notify RN    BASIC METABOLIC PANEL      Result Value Range   Sodium 139  135 - 145 mEq/L   Potassium 4.0  3.5 - 5.1 mEq/L   Chloride 104  96 - 112 mEq/L   CO2 28  19 - 32 mEq/L   Glucose, Bld 119 (*) 70 - 99 mg/dL   BUN 17  6 - 23 mg/dL   Creatinine, Ser 4.09  0.50 - 1.35 mg/dL   Calcium 9.0  8.4 - 81.1 mg/dL   GFR calc non Af Amer 58 (*) >90 mL/min    GFR calc Af Amer 67 (*) >90 mL/min  CBC WITH DIFFERENTIAL      Result Value Range   WBC 5.3  4.0 - 10.5 K/uL   RBC 4.31  4.22 - 5.81 MIL/uL   Hemoglobin 12.2 (*) 13.0 - 17.0 g/dL   HCT 91.4 (*) 78.2 - 95.6 %   MCV 87.7  78.0 - 100.0 fL   MCH 28.3  26.0 - 34.0 pg   MCHC 32.3  30.0 - 36.0 g/dL   RDW 21.3  08.6 - 57.8 %   Platelets 144 (*) 150 - 400 K/uL   Neutrophils Relative % 70  43 - 77 %   Neutro Abs 3.7  1.7 - 7.7 K/uL   Lymphocytes Relative 20  12 - 46 %   Lymphs Abs 1.1  0.7 - 4.0 K/uL   Monocytes Relative 7  3 - 12 %   Monocytes Absolute 0.4  0.1 - 1.0 K/uL   Eosinophils Relative 2  0 - 5 %   Eosinophils Absolute 0.1  0.0 - 0.7 K/uL   Basophils Relative 1  0 - 1 %   Basophils Absolute 0.1  0.0 - 0.1 K/uL  TROPONIN I      Result Value Range   Troponin I <0.30  <0.30 ng/mL   Dg Clavicle Right  03/12/2013   CLINICAL DATA:  Fall with right clavicular pain.  EXAM: RIGHT CLAVICLE - 2+ VIEWS  COMPARISON:  None.  FINDINGS: There is no evidence of fracture or other focal bone lesions. [facility] joint shows normal alignment. Soft tissues are unremarkable.  IMPRESSION: Normal right clavicle.   Electronically Signed   By: Irish Lack   On: 03/12/2013 17:22   Ct Head Wo Contrast  03/12/2013   *RADIOLOGY REPORT*  Clinical Data:  Syncope.  Altered level of consciousness.  CT HEAD WITHOUT CONTRAST CT CERVICAL SPINE WITHOUT CONTRAST  Technique:  Multidetector CT imaging of the head and cervical spine was performed following the standard protocol without intravenous contrast.  Multiplanar CT image reconstructions of the cervical spine were also generated.  Comparison:  09/22/2010  CT HEAD  Findings: A lateral right frontal craniotomy defect is noted.  The previously noted meningioma has been resected. No mass effect, midline shift, or acute intracranial hemorrhage.  Patchy chronic ischemic changes in the periventricular white matter are present. Moderate global atrophy.  Mastoid air cells are  clear.  Visualized paranasal sinuses are clear.  No evidence of acute skull fracture.  IMPRESSION: No acute intracranial pathology.  Postoperative changes.  CT CERVICAL SPINE  Findings: No acute fracture and no dislocation.  Scattered degenerative changes throughout the cervical spine are noted.  Advanced right foraminal narrowing at C3-4.  Mild left foraminal narrowing at C4-5.  Posterior disc osteophytes encroach upon the central canal at C5-6 and there is bilateral foraminal narrowing left greater than right.  Near narrowing at C6-7 without significant central stenosis. Uncovertebral osteophytes cause minimal foraminal narrowing bilaterally.  No obvious spinal hematoma or large disc herniation.  No obvious soft tissue injury.  IMPRESSION: No evidence of acute bony injury in the cervical spine.  Scattered degenerative changes are noted.   Original Report Authenticated By: Jolaine Click, M.D.   Ct Cervical Spine Wo Contrast  03/12/2013   *RADIOLOGY REPORT*  Clinical Data:  Syncope.  Altered level of consciousness.  CT HEAD WITHOUT CONTRAST CT CERVICAL SPINE WITHOUT CONTRAST  Technique:  Multidetector CT imaging of the head and cervical spine was performed following the standard protocol without intravenous contrast.  Multiplanar CT image reconstructions of the cervical spine were also generated.  Comparison:  09/22/2010  CT HEAD  Findings: A lateral right frontal craniotomy defect is noted.  The previously noted meningioma has been resected. No mass effect, midline shift, or acute intracranial hemorrhage.  Patchy chronic ischemic changes in the periventricular white matter are present. Moderate global atrophy.  Mastoid air cells are clear.  Visualized paranasal sinuses are clear.  No evidence of acute skull fracture.  IMPRESSION: No acute intracranial pathology.  Postoperative changes.  CT CERVICAL SPINE  Findings: No acute fracture and no dislocation.  Scattered degenerative changes throughout the cervical spine  are noted.  Advanced right foraminal narrowing at C3-4.  Mild left foraminal narrowing at C4-5.  Posterior disc osteophytes encroach upon the central canal at C5-6 and there is bilateral foraminal narrowing left greater than right.  Near narrowing at C6-7 without significant central stenosis. Uncovertebral osteophytes cause minimal foraminal narrowing bilaterally.  No obvious spinal hematoma or large disc herniation.  No obvious soft tissue injury.  IMPRESSION: No evidence of acute bony injury in the cervical spine.  Scattered degenerative changes are noted.   Original Report Authenticated By: Jolaine Click, M.D.   Date: 03/12/2013  Rate: 71  Rhythm: normal sinus rhythm  QRS Axis: normal  Intervals: normal  ST/T Wave abnormalities: normal  Conduction Disutrbances: none  Narrative Interpretation: unremarkable  PVC    MDM  No diagnosis found. Uncertain etiology of syncopal spell. EKG, hemoglobin, glucose, CT head negative.   Patient is normally normotensive. He is hypertensive today. Discussed with Dr. Rito Ehrlich. D-dimer and chest x-ray pending  I personally performed the services described in this documentation, which was scribed in my presence. The recorded information has been reviewed and is accurate.    Donnetta Hutching, MD 03/12/13 1610  Donnetta Hutching, MD 03/16/13 2037

## 2013-03-12 NOTE — ED Notes (Signed)
Patient ambulated around nursing station as directed by Dr. Adriana Simas. Patient denied dizziness, no syncopal episode. Patient oxygen saturation dropped to 76% on room air after ambulation. Patient reported was short of breath, but no worse than his baseline after ambulating. Patient oxygen saturation up to 97% on 2 liters of oxygen via nasal canula. Dr. Adriana Simas notified.

## 2013-03-12 NOTE — ED Notes (Signed)
Pt reports was getting off the lawnmower and had a syncopal episode.  C/O bilateral shoulder pain.  EMS has pt fully immobilized with LSB but was unable to place c collar due to pt's anatomy.  EMS administered 6mg  morphine PTA.  Pt was laying on lawnmower unable to get up.  Reports attempted to call 911 but couldn't press send on his phone.  EMS reports the mail carrier found pt and called 911.

## 2013-03-12 NOTE — ED Notes (Signed)
Patient oxygen saturation on room air 85-90%. Patient reports history of COPD. Denies shortness of breath or trouble breathing. States was given oxygen to wear at home and nebulizers, but patient reports he does not use them. Patient placed on 2 liters of oxygen via nasal canula. Oxygen saturation up to 95%. Dr. Adriana Simas notified.

## 2013-03-12 NOTE — ED Notes (Signed)
Patient vomited x 1 while this nurse was preparing to administer ordered zofran and morphine. Dr. Adriana Simas notified.

## 2013-03-13 ENCOUNTER — Inpatient Hospital Stay (HOSPITAL_COMMUNITY): Payer: Medicare Other

## 2013-03-13 ENCOUNTER — Encounter (HOSPITAL_COMMUNITY): Payer: Self-pay | Admitting: Internal Medicine

## 2013-03-13 DIAGNOSIS — I10 Essential (primary) hypertension: Secondary | ICD-10-CM | POA: Diagnosis present

## 2013-03-13 DIAGNOSIS — J449 Chronic obstructive pulmonary disease, unspecified: Secondary | ICD-10-CM | POA: Diagnosis present

## 2013-03-13 DIAGNOSIS — R0902 Hypoxemia: Secondary | ICD-10-CM

## 2013-03-13 DIAGNOSIS — I7090 Unspecified atherosclerosis: Secondary | ICD-10-CM | POA: Diagnosis not present

## 2013-03-13 DIAGNOSIS — J441 Chronic obstructive pulmonary disease with (acute) exacerbation: Secondary | ICD-10-CM | POA: Diagnosis not present

## 2013-03-13 DIAGNOSIS — J438 Other emphysema: Secondary | ICD-10-CM | POA: Diagnosis not present

## 2013-03-13 DIAGNOSIS — R55 Syncope and collapse: Principal | ICD-10-CM

## 2013-03-13 LAB — CBC
HCT: 40.1 % (ref 39.0–52.0)
Hemoglobin: 12.9 g/dL — ABNORMAL LOW (ref 13.0–17.0)
MCH: 28.2 pg (ref 26.0–34.0)
MCHC: 32.2 g/dL (ref 30.0–36.0)

## 2013-03-13 LAB — COMPREHENSIVE METABOLIC PANEL
ALT: 54 U/L — ABNORMAL HIGH (ref 0–53)
AST: 33 U/L (ref 0–37)
CO2: 28 mEq/L (ref 19–32)
Calcium: 9.1 mg/dL (ref 8.4–10.5)
Sodium: 135 mEq/L (ref 135–145)
Total Protein: 6.7 g/dL (ref 6.0–8.3)

## 2013-03-13 MED ORDER — ALBUTEROL SULFATE (5 MG/ML) 0.5% IN NEBU: 2.5000 mg | INHALATION_SOLUTION | RESPIRATORY_TRACT | Status: DC | PRN

## 2013-03-13 MED ORDER — SERTRALINE HCL 50 MG PO TABS
50.0000 mg | ORAL_TABLET | Freq: Every day | ORAL | Status: DC
  Administered 2013-03-13: 50 mg via ORAL
  Filled 2013-03-13: qty 1

## 2013-03-13 MED ORDER — PREDNISONE 10 MG PO TABS: ORAL_TABLET | ORAL | Status: DC

## 2013-03-13 MED ORDER — SODIUM CHLORIDE 0.9 % IJ SOLN
3.0000 mL | Freq: Two times a day (BID) | INTRAMUSCULAR | Status: DC
  Administered 2013-03-13: 3 mL via INTRAVENOUS

## 2013-03-13 MED ORDER — ALBUTEROL SULFATE HFA 108 (90 BASE) MCG/ACT IN AERS: 2.0000 | INHALATION_SPRAY | Freq: Four times a day (QID) | RESPIRATORY_TRACT | Status: AC | PRN

## 2013-03-13 MED ORDER — LEVOFLOXACIN 500 MG PO TABS
500.0000 mg | ORAL_TABLET | Freq: Every day | ORAL | Status: DC
  Administered 2013-03-13: 500 mg via ORAL
  Filled 2013-03-13: qty 1

## 2013-03-13 MED ORDER — LEVOFLOXACIN 500 MG PO TABS: 500.0000 mg | ORAL_TABLET | Freq: Every day | ORAL | Status: DC

## 2013-03-13 MED ORDER — ONDANSETRON HCL 4 MG/2ML IJ SOLN: 4.0000 mg | Freq: Four times a day (QID) | INTRAMUSCULAR | Status: DC | PRN

## 2013-03-13 MED ORDER — OXYCODONE HCL 5 MG PO TABS
5.0000 mg | ORAL_TABLET | ORAL | Status: DC | PRN
  Administered 2013-03-13: 5 mg via ORAL
  Filled 2013-03-13: qty 1

## 2013-03-13 MED ORDER — LORAZEPAM 0.5 MG PO TABS: 0.5000 mg | ORAL_TABLET | Freq: Two times a day (BID) | ORAL | Status: DC | PRN

## 2013-03-13 MED ORDER — AMLODIPINE BESYLATE 5 MG PO TABS
10.0000 mg | ORAL_TABLET | Freq: Every day | ORAL | Status: DC
  Administered 2013-03-13: 10 mg via ORAL
  Filled 2013-03-13: qty 2

## 2013-03-13 MED ORDER — FLUTICASONE PROPIONATE HFA 44 MCG/ACT IN AERO
2.0000 | INHALATION_SPRAY | Freq: Two times a day (BID) | RESPIRATORY_TRACT | Status: DC
  Administered 2013-03-13: 2 via RESPIRATORY_TRACT
  Filled 2013-03-13: qty 10.6

## 2013-03-13 MED ORDER — LISINOPRIL 10 MG PO TABS
20.0000 mg | ORAL_TABLET | Freq: Every day | ORAL | Status: DC
  Administered 2013-03-13: 20 mg via ORAL
  Filled 2013-03-13: qty 2

## 2013-03-13 MED ORDER — GABAPENTIN 300 MG PO CAPS
300.0000 mg | ORAL_CAPSULE | Freq: Three times a day (TID) | ORAL | Status: DC
  Administered 2013-03-13 (×2): 300 mg via ORAL
  Filled 2013-03-13 (×2): qty 1

## 2013-03-13 MED ORDER — ACETAMINOPHEN 325 MG PO TABS
650.0000 mg | ORAL_TABLET | Freq: Four times a day (QID) | ORAL | Status: DC | PRN
  Administered 2013-03-13 (×2): 650 mg via ORAL
  Filled 2013-03-13 (×2): qty 2

## 2013-03-13 MED ORDER — SODIUM CHLORIDE 0.9 % IV SOLN
INTRAVENOUS | Status: AC
  Administered 2013-03-13 (×2): via INTRAVENOUS

## 2013-03-13 MED ORDER — METHYLPREDNISOLONE SODIUM SUCC 125 MG IJ SOLR
60.0000 mg | Freq: Four times a day (QID) | INTRAMUSCULAR | Status: DC
  Administered 2013-03-13 (×3): 60 mg via INTRAVENOUS
  Filled 2013-03-13 (×3): qty 2

## 2013-03-13 MED ORDER — ALBUTEROL SULFATE (5 MG/ML) 0.5% IN NEBU
2.5000 mg | INHALATION_SOLUTION | Freq: Four times a day (QID) | RESPIRATORY_TRACT | Status: DC
  Administered 2013-03-13 (×2): 2.5 mg via RESPIRATORY_TRACT
  Filled 2013-03-13 (×2): qty 0.5

## 2013-03-13 MED ORDER — ENOXAPARIN SODIUM 40 MG/0.4ML ~~LOC~~ SOLN
40.0000 mg | SUBCUTANEOUS | Status: DC
  Administered 2013-03-13: 40 mg via SUBCUTANEOUS
  Filled 2013-03-13: qty 0.4

## 2013-03-13 MED ORDER — IPRATROPIUM BROMIDE 0.02 % IN SOLN
0.5000 mg | Freq: Three times a day (TID) | RESPIRATORY_TRACT | Status: DC
  Administered 2013-03-13: 0.5 mg via RESPIRATORY_TRACT
  Filled 2013-03-13: qty 2.5

## 2013-03-13 MED ORDER — SIMVASTATIN 20 MG PO TABS
40.0000 mg | ORAL_TABLET | Freq: Every day | ORAL | Status: DC
  Administered 2013-03-13: 40 mg via ORAL
  Filled 2013-03-13: qty 2

## 2013-03-13 MED ORDER — PANTOPRAZOLE SODIUM 40 MG PO TBEC
40.0000 mg | DELAYED_RELEASE_TABLET | Freq: Every day | ORAL | Status: DC
  Administered 2013-03-13: 40 mg via ORAL
  Filled 2013-03-13: qty 1

## 2013-03-13 MED ORDER — ONDANSETRON HCL 4 MG PO TABS: 4.0000 mg | ORAL_TABLET | Freq: Four times a day (QID) | ORAL | Status: DC | PRN

## 2013-03-13 MED ORDER — ACETAMINOPHEN 650 MG RE SUPP: 650.0000 mg | Freq: Four times a day (QID) | RECTAL | Status: DC | PRN

## 2013-03-13 MED ORDER — IOHEXOL 350 MG/ML SOLN
120.0000 mL | Freq: Once | INTRAVENOUS | Status: AC | PRN
  Administered 2013-03-13: 120 mL via INTRAVENOUS

## 2013-03-13 MED ORDER — SIMVASTATIN 20 MG PO TABS: 40.0000 mg | ORAL_TABLET | Freq: Every day | ORAL | Status: DC

## 2013-03-13 MED ORDER — ALBUTEROL SULFATE (5 MG/ML) 0.5% IN NEBU
2.5000 mg | INHALATION_SOLUTION | Freq: Three times a day (TID) | RESPIRATORY_TRACT | Status: DC
  Administered 2013-03-13: 2.5 mg via RESPIRATORY_TRACT
  Filled 2013-03-13: qty 0.5

## 2013-03-13 MED ORDER — IPRATROPIUM BROMIDE 0.02 % IN SOLN
0.5000 mg | Freq: Four times a day (QID) | RESPIRATORY_TRACT | Status: DC
  Administered 2013-03-13 (×2): 0.5 mg via RESPIRATORY_TRACT
  Filled 2013-03-13 (×2): qty 2.5

## 2013-03-13 NOTE — Progress Notes (Signed)
Utilization Review Complete  

## 2013-03-13 NOTE — Evaluation (Signed)
Occupational Therapy Evaluation Patient Details Name: Harold York MRN: [id number] DOB: 1936-01-30 Today's Date: 03/13/2013 Time: 4098-1191 OT Time Calculation (min): 26 min  OT Assessment / Plan / Recommendation     Clinical Impression   Patient is a 77 y/o male s/p Syncope presenting to acute OT with all education complete. Patient is performing at baseline and is very content living alone. Patient was educated several times on the use of compensatory breathing techniques and stated that he uses the techniques at home. Patient is mostly concerned with getting rid of his headache and returning home. No further OT needs at this time; will sign off.       OT Assessment  Patient does not need any further OT services    Follow Up Recommendations  No OT follow up    Barriers to Discharge  None    Equipment Recommendations  None recommended by OT          Precautions / Restrictions Precautions Precautions: None   Pertinent Vitals/Pain 8/10 headache. Pain meds given by nursing.     ADL  Lower Body Dressing: Performed;+1 Total assistance (Due to height of bed. ) Where Assessed - Lower Body Dressing: Unsupported sitting Toilet Transfer: Performed;Independent Acupuncturist:  (to recliner) Transfers/Ambulation Related to ADLs: Patient ambulates without device. patient transfers independently from bed to recliner. Patient does move very fast with transitions.  ADL Comments: Educated patient on the use of a sock aid to assist in donning socks. Patient states that his sofa is lower and is not interested in use of device.       OT Goals(Current goals can be found in the care plan section)  1 time eval.   Visit Information  Last OT Received On: 03/13/13 Assistance Needed: +1 PT/OT Co-Evaluation/Treatment: Yes       Prior Functioning     Home Living Family/patient expects to be discharged to:: Private residence Living Arrangements: Alone Available Help at  Discharge: Friend(s) Type of Home: House [place] farm) Home Access: Stairs to enter Secretary/administrator of Steps: 4 Home Equipment: Insurance underwriter - 2 wheels (lift chair) Additional Comments: [place]. Has 1 dog and 3 horses.  Prior Function Level of Independence: Independent with assistive device(s) Communication Communication: No difficulties Dominant Hand: Right         Vision/Perception Vision - History Baseline Vision: No visual deficits Patient Visual Report: No change from baseline   Cognition  Cognition Arousal/Alertness: Awake/alert Behavior During Therapy: WFL for tasks assessed/performed Overall Cognitive Status: Within Functional Limits for tasks assessed    Extremity/Trunk Assessment Upper Extremity Assessment Upper Extremity Assessment: Overall WFL for tasks assessed     Mobility Bed Mobility Bed Mobility: Supine to Sit Supine to Sit: 7: Independent Transfers Transfers: Sit to Stand;Stand to Sit Sit to Stand: 7: Independent Stand to Sit: 7: Independent           End of Session OT - End of Session Equipment Utilized During Treatment: Gait belt Activity Tolerance: Patient tolerated treatment well Patient left: in chair;with call bell/phone within reach;with chair alarm set    [employer], OTR/L,CBIS   03/13/2013, 9:37 AM

## 2013-03-13 NOTE — Progress Notes (Signed)
D/c instructions reviewed with patient.  Verbalized understanding.  Pt dc'd to home with family. Schonewitz, Zaven Klemens Anne 03/13/2013  

## 2013-03-13 NOTE — Evaluation (Signed)
Physical Therapy Evaluation Patient Details Name: Harold York MRN: [id number] DOB: 10/25/1935 Today's Date: 03/13/2013 Time: 1191-4782 PT Time Calculation (min): 26 min  PT Assessment / Plan / Recommendation History of Present Illness  Pt is admitted with a syncopal episode at home.  He is s/p removal of a meningioma (2 years) ago and states that he has had chronic right sided head pain since that time.  He has an extremely short neck and may have significant angulation at the cervical spine.  I am  aware if his neck has been evaluatted for cervical impingement, however he did not c/o any hand or arm sx.  Clinical Impression   Pt is seen for evaluation.  He was on 3 L O2 at time of our arrival and he c/o that O2 has never helped him and he did not understand why he had to have it on.  His O2  Sat was checked and found to be at 91-92%, which he states is his baseline.  He lives alone and is normally very active.  His functional status is at baseline with no dizziness reported.  He remained on RA and O2 sat had dropped into the mid 70s, but rebounded to 92% within 1 minute of rest.  No Pt is indicated.    PT Assessment  Patent does not need any further PT services    Follow Up Recommendations  No PT follow up    Does the patient have the potential to tolerate intense rehabilitation      Barriers to Discharge        Equipment Recommendations  None recommended by PT    Recommendations for Other Services     Frequency      Precautions / Restrictions Precautions Precautions: None   Pertinent Vitals/Pain       Mobility  Bed Mobility Bed Mobility: Supine to Sit Supine to Sit: 7: Independent Transfers Sit to Stand: 7: Independent Stand to Sit: 7: Independent Ambulation/Gait Ambulation/Gait Assistance: 7: Independent Ambulation Distance (Feet): 300 Feet Assistive device: None Gait Pattern: Within Functional Limits Gait velocity: rapid pace... pt was instructed to slow his  pace to help control dyspnea Stairs: No Wheelchair Mobility Wheelchair Mobility: No    Exercises     PT Diagnosis:    PT Problem List:   PT Treatment Interventions:       PT Goals(Current goals can be found in the care plan section) Acute Rehab PT Goals PT Goal Formulation: No goals set, d/c therapy  Visit Information  Last PT Received On: 03/13/13 Assistance Needed: +1 History of Present Illness: Pt is admitted with a syncopal episode at home.  He is s/p removal of a meningioma (2 years) ago and states that he has had chronic right sided head pain since that time.  He has an extremely short neck and may have significant angulation at the cervical spine.  I am  aware if his neck has been evaluatted for cervical impingement, however he did not c/o any hand or arm sx.       Prior Functioning  Home Living Family/patient expects to be discharged to:: Private residence Living Arrangements: Alone Available Help at Discharge: Friend(s) Type of Home: [place] Home Access: Stairs to enter Secretary/administrator of Steps: 4 Home Equipment: Insurance underwriter - 2 wheels (lift chair) Additional Comments: [place]. Has 1 dog and 3 horses.  Prior Function Level of Independence: Independent with assistive device(s) Communication Communication: No  difficulties Dominant Hand: Right    Cognition  Cognition Arousal/Alertness: Awake/alert Behavior During Therapy: WFL for tasks assessed/performed Overall Cognitive Status: Within Functional Limits for tasks assessed    Extremity/Trunk Assessment Upper Extremity Assessment Upper Extremity Assessment: Overall WFL for tasks assessed Lower Extremity Assessment Lower Extremity Assessment: Overall WFL for tasks assessed Cervical / Trunk Assessment Cervical / Trunk Assessment: Other exceptions Cervical / Trunk Exceptions: appears to have a severe cervical lordosis   Balance Balance Balance Assessed:  Yes Static Standing Balance Static Standing - Balance Support: No upper extremity supported Static Standing - Level of Assistance: 7: Independent  End of Session PT - End of Session Equipment Utilized During Treatment: Gait belt Activity Tolerance: Patient tolerated treatment well Patient left: in chair;with chair alarm set Nurse Communication: Mobility status  GP     Konrad Penta 03/13/2013, 9:49 AM

## 2013-03-13 NOTE — ED Notes (Signed)
Son left contact number to reach him : Harold York  [phone] or [phone].  Two friends left contact numbers : Harold York and Harold York at [phone]

## 2013-03-13 NOTE — Discharge Summary (Signed)
Physician Discharge Summary  Harold York:[id number] DOB: 03/20/1936 DOA: 03/12/2013  PCP: Toma Deiters, MD  Admit date: 03/12/2013 Discharge date: 03/13/2013  Time spent: 45 minutes  Recommendations for Outpatient Follow-up:  1. Patient has been scheduled to have an outpatient echo 2. Follow up with primary care physician in 1-2 weeks  Discharge Diagnoses:  Principal Problem:   Syncope Active Problems:   COPD (chronic obstructive pulmonary disease)   COPD exacerbation   HTN (hypertension)   Accelerated hypertension   Hypoxia   Discharge Condition: improved  Diet recommendation: low salt  Filed Weights   03/12/13 1622 03/13/13 0205  Weight: 113.399 kg (250 lb) 119.4 kg (263 lb 3.7 oz)    History of present illness:  Harold York is a 77 y.o. male with a past medical history of COPD, hypertension, history of prostate cancer, who was in his usual state of health till this morning when he was mowing his lawn. And, then when he stood up from the lawnmower he felt a sharp pain in the right side of his head and then he passed out. He thinks he may have had chest pain, and shortness of breath, but he's not sure. He is alert, but more short of breath than usual, but denies any chest pain currently. Also, had some nausea and vomiting while he was in the emergency department. He did feel dizzy earlier today. He was found to be hypoxic in the ED, with saturations in the 70s and early 80s. He denies using oxygen at home. Headache is better but still persists. He is a poor historian.   Hospital Course:  This patient was admitted to the hospital with shortness of breath, wheezing secondary to COPD exacerbation as well as syncope. CT angiography chest was negative for pulmonary embolus. Carotid Doppler did not show significant stenosis bilaterally. CT of the head was also unremarkable. He does not have any neurologic deficits. Telemetry has been unremarkable his EKG showed occasional  PVCs but otherwise normal sinus rhythm. He then scheduled for an outpatient echocardiogram. He's not had any further lightheadedness/dizziness or any other symptoms. Regarding shortness of breath, he was started on steroids and nebulizer treatments. He feels significantly improved and back to his baseline respiratory status. He ambulated on the nursing unit and did not have any difficulty. He is requesting discharge home.  Procedures:  none  Consultations:  none  Discharge Exam: Filed Vitals:   03/13/13 1439  BP: 146/80  Pulse: 81  Temp: 97.6 F (36.4 C)  Resp: 18    General: NAD Cardiovascular: S1, S2 RRR Respiratory: diminished breath sounds, but no wheezes  Discharge Instructions  Discharge Orders   Future Appointments Provider Department Dept Phone   03/15/2013 10:30 AM [facility] [place] [facility] [phone]   Future Orders Complete By Expires   Call MD for:  difficulty breathing, headache or visual disturbances  As directed    Call MD for:  extreme fatigue  As directed    Call MD for:  persistant dizziness or light-headedness  As directed    Call MD for:  temperature >100.4  As directed    Diet - low sodium heart healthy  As directed    Increase activity slowly  As directed        Medication List         albuterol 108 (90 BASE) MCG/ACT inhaler  Commonly known as:  PROVENTIL HFA;VENTOLIN HFA  Inhale 2 puffs into the lungs every 6 (six) hours  as needed for wheezing.     amLODipine 10 MG tablet  Commonly known as:  NORVASC  Take 10 mg by mouth daily.     ARTHROTEC 75-0.2 MG Tbec  Generic drug:  Diclofenac-Misoprostol  Take 1 tablet by mouth 2 (two) times daily.     fexofenadine 180 MG tablet  Commonly known as:  ALLEGRA  Take 180 mg by mouth daily.     gabapentin 300 MG capsule  Commonly known as:  NEURONTIN  Take 300 mg by mouth 3 (three) times daily.     levofloxacin 500 MG tablet  Commonly known as:  LEVAQUIN  Take 1  tablet (500 mg total) by mouth daily before breakfast.     lisinopril 20 MG tablet  Commonly known as:  PRINIVIL,ZESTRIL  Take 20 mg by mouth daily.     LORazepam 0.5 MG tablet  Commonly known as:  ATIVAN  Take 0.5 mg by mouth 2 (two) times daily.     mometasone 220 MCG/INH inhaler  Commonly known as:  ASMANEX  Inhale 2 puffs into the lungs at bedtime.     omeprazole 20 MG capsule  Commonly known as:  PRILOSEC  Take 20 mg by mouth daily.     predniSONE 10 MG tablet  Commonly known as:  DELTASONE  Take 40mg  po daily for 2 days then 30mg  po daily for 2 days, then 20mg  po daily for 2 days then 10mg  po daily for 2 days then stop     sertraline 50 MG tablet  Commonly known as:  ZOLOFT  Take 50 mg by mouth daily.     simvastatin 40 MG tablet  Commonly known as:  ZOCOR  Take 40 mg by mouth daily.       Allergies  Allergen Reactions  . Lidocaine   . Penicillins        Follow-up Information   Follow up with [facility]. (Appointment for Out Patient ECHO Thursday Sept. 18th at 10:30am)       Follow up with [facility], MD. Schedule an appointment as soon as possible for a visit in 2 weeks.   Specialty:  Internal Medicine   Contact information:   [address] [place] [address] [phone]        The results of significant diagnostics from this hospitalization (including imaging, microbiology, ancillary and laboratory) are listed below for reference.    Significant Diagnostic Studies: Dg Chest 2 View  03/12/2013   CLINICAL DATA:  Loss of consciousness. Fall.  EXAM: CHEST  2 VIEW  COMPARISON:  11/01/2012  FINDINGS: No cardiomegaly when accounting for pericardial fat pad. Unchanged upper mediastinal contours.  No change from prior to suggest acute infiltrate, edema, effusion, or pneumothorax. No acute osseous findings. Bilateral glenohumeral osteoarthritis.  IMPRESSION: No active cardiopulmonary disease.   Electronically Signed   By: Tiburcio Pea   On:  03/12/2013 23:13   Dg Clavicle Right  03/12/2013   CLINICAL DATA:  Fall with right clavicular pain.  EXAM: RIGHT CLAVICLE - 2+ VIEWS  COMPARISON:  None.  FINDINGS: There is no evidence of fracture or other focal bone lesions. The [facility] joint shows normal alignment. Soft tissues are unremarkable.  IMPRESSION: Normal right clavicle.   Electronically Signed   By: Irish Lack   On: 03/12/2013 17:22   Ct Head Wo Contrast  03/12/2013   *RADIOLOGY REPORT*  Clinical Data:  Syncope.  Altered level of consciousness.  CT HEAD WITHOUT CONTRAST CT CERVICAL SPINE WITHOUT  CONTRAST  Technique:  Multidetector CT imaging of the head and cervical spine was performed following the standard protocol without intravenous contrast.  Multiplanar CT image reconstructions of the cervical spine were also generated.  Comparison:  09/22/2010  CT HEAD  Findings: A lateral right frontal craniotomy defect is noted.  The previously noted meningioma has been resected. No mass effect, midline shift, or acute intracranial hemorrhage.  Patchy chronic ischemic changes in the periventricular white matter are present. Moderate global atrophy.  Mastoid air cells are clear.  Visualized paranasal sinuses are clear.  No evidence of acute skull fracture.  IMPRESSION: No acute intracranial pathology.  Postoperative changes.  CT CERVICAL SPINE  Findings: No acute fracture and no dislocation.  Scattered degenerative changes throughout the cervical spine are noted.  Advanced right foraminal narrowing at C3-4.  Mild left foraminal narrowing at C4-5.  Posterior disc osteophytes encroach upon the central canal at C5-6 and there is bilateral foraminal narrowing left greater than right.  Near narrowing at C6-7 without significant central stenosis. Uncovertebral osteophytes cause minimal foraminal narrowing bilaterally.  No obvious spinal hematoma or large disc herniation.  No obvious soft tissue injury.  IMPRESSION: No evidence of acute bony injury in the  cervical spine.  Scattered degenerative changes are noted.   Original Report Authenticated By: Jolaine Click, M.D.   Ct Angio Chest Pe W/cm &/or Wo Cm  03/13/2013   CLINICAL DATA:  Syncope was shortness of breath.  EXAM: CT ANGIOGRAPHY CHEST WITH CONTRAST  TECHNIQUE: Multidetector CT imaging of the chest was performed using the standard protocol during bolus administration of intravenous contrast. Multiplanar CT image reconstructions including MIPs were obtained to evaluate the vascular anatomy.  CONTRAST:  OMNIPAQUE IOHEXOL 350 MG/ML SOLN  COMPARISON:  01/15/2010  FINDINGS: THORACIC INLET/BODY WALL:  No acute abnormality.  MEDIASTINUM:  Normal heart size. No pericardial effusion. No acute vascular abnormality. No adenopathy.  LUNG WINDOWS:  No consolidation. No effusion. There are multiple pulmonary nodules which are unchanged from prior. A lateral left costophrenic sulcus nodule with central coarse calcification is unchanged at 1 cm. Spiculated nodule in the upper right lobe continues to measure 9 mm. Additional smaller bilateral pulmonary nodules, some calcified, also remain not significantly changed. Biapical centrilobular emphysema and diffuse bronchial wall thickening.  UPPER ABDOMEN:  No acute findings.  OSSEOUS:  Severe bilateral glenohumeral osteoarthritis.  Review of the MIP images confirms the above findings.  IMPRESSION: 1. Negative for pulmonary embolism. 2. Emphysema.   Electronically Signed   By: Tiburcio Pea   On: 03/13/2013 02:42   Ct Cervical Spine Wo Contrast  03/12/2013   *RADIOLOGY REPORT*  Clinical Data:  Syncope.  Altered level of consciousness.  CT HEAD WITHOUT CONTRAST CT CERVICAL SPINE WITHOUT CONTRAST  Technique:  Multidetector CT imaging of the head and cervical spine was performed following the standard protocol without intravenous contrast.  Multiplanar CT image reconstructions of the cervical spine were also generated.  Comparison:  09/22/2010  CT HEAD  Findings: A lateral  right frontal craniotomy defect is noted.  The previously noted meningioma has been resected. No mass effect, midline shift, or acute intracranial hemorrhage.  Patchy chronic ischemic changes in the periventricular white matter are present. Moderate global atrophy.  Mastoid air cells are clear.  Visualized paranasal sinuses are clear.  No evidence of acute skull fracture.  IMPRESSION: No acute intracranial pathology.  Postoperative changes.  CT CERVICAL SPINE  Findings: No acute fracture and no dislocation.  Scattered degenerative changes throughout  the cervical spine are noted.  Advanced right foraminal narrowing at C3-4.  Mild left foraminal narrowing at C4-5.  Posterior disc osteophytes encroach upon the central canal at C5-6 and there is bilateral foraminal narrowing left greater than right.  Near narrowing at C6-7 without significant central stenosis. Uncovertebral osteophytes cause minimal foraminal narrowing bilaterally.  No obvious spinal hematoma or large disc herniation.  No obvious soft tissue injury.  IMPRESSION: No evidence of acute bony injury in the cervical spine.  Scattered degenerative changes are noted.   Original Report Authenticated By: Jolaine Click, M.D.   US Carotid Duplex Bilateral  03/13/2013   CLINICAL DATA:  Syncope.  EXAM: BILATERAL CAROTID DUPLEX ULTRASOUND  TECHNIQUE: Wallace Cullens scale imaging, color Doppler and duplex ultrasound were performed of bilateral carotid and vertebral arteries in the neck.  COMPARISON:  Head CT 03/12/2013  FINDINGS: Criteria: Quantification of carotid stenosis is based on velocity parameters that correlate the residual internal carotid diameter with NASCET-based stenosis levels, using the diameter of the distal internal carotid lumen as the denominator for stenosis measurement.  The following velocity measurements were obtained:  RIGHT  ICA:  57 cm/sec  CCA:  200 cm/sec  SYSTOLIC ICA/CCA RATIO:  0.3  DIASTOLIC ICA/CCA RATIO:  0.9  ECA:  89 cm/sec  LEFT  ICA:  65  cm/sec  CCA:  81 cm/sec  SYSTOLIC ICA/CCA RATIO:  0.8  DIASTOLIC ICA/CCA RATIO:  1.8  ECA:  108 cm/sec  RIGHT CAROTID ARTERY: Mild to moderate echogenic plaque at the right carotid bulb. The right carotid arteries are patent. Plaque extends into the proximal internal carotid artery. The waveforms and velocities within the right internal carotid artery are normal.  RIGHT VERTEBRAL ARTERY: Antegrade flow and normal waveform in the right vertebral artery.  LEFT CAROTID ARTERY: Intimal thickening in the left common carotid artery. Small amount of plaque in the proximal internal carotid artery. The waveforms and velocities in the left internal carotid artery are within normal limits.  LEFT VERTEBRAL ARTERY: Antegrade flow and normal waveform in the left vertebral artery.  IMPRESSION: Atherosclerotic disease in the carotid arteries, right side greater than left. Estimated degree of stenosis in the internal carotid arteries is less than 50% bilaterally.  Patent vertebral arteries.   Electronically Signed   By: Richarda Overlie M.D.   On: 03/13/2013 12:36    Microbiology: No results found for this or any previous visit (from the past 240 hour(s)).   Labs: Basic Metabolic Panel:  Recent Labs Lab 03/12/13 1738 03/13/13 0233  NA 139 135  K 4.0 4.5  CL 104 100  CO2 28 28  GLUCOSE 119* 117*  BUN 17 15  CREATININE 1.19 1.04  CALCIUM 9.0 9.1   Liver Function Tests:  Recent Labs Lab 03/13/13 0233  AST 33  ALT 54*  ALKPHOS 81  BILITOT 0.5  PROT 6.7  ALBUMIN 3.5   No results found for this basename: LIPASE, AMYLASE,  in the last 168 hours No results found for this basename: AMMONIA,  in the last 168 hours CBC:  Recent Labs Lab 03/12/13 1738 03/13/13 0233  WBC 5.3 6.9  NEUTROABS 3.7  --   HGB 12.2* 12.9*  HCT 37.8* 40.1  MCV 87.7 87.7  PLT 144* 157   Cardiac Enzymes:  Recent Labs Lab 03/12/13 1738 03/13/13 0233 03/13/13 0813 03/13/13 1430  TROPONINI <0.30 <0.30 <0.30 <0.30    BNP: BNP (last 3 results) No results found for this basename: PROBNP,  in the last 8760 hours  CBG:  Recent Labs Lab 03/12/13 1623  GLUCAP 108*       Signed:  MEMON,JEHANZEB  [facility] 03/13/2013, 4:33 PM

## 2013-03-13 NOTE — H&P (Signed)
[facility] History and Physical  BAPTISTE LITTLER ZOX:[id number] DOB: 23-Jun-1936 DOA: 03/12/2013   PCP: Toma Deiters, MD  Specialists: He is followed by urologist, as well as a pulmonologist  Chief Complaint: Passed out  HPI: Harold York is a 77 y.o. male with a past medical history of COPD, hypertension, history of prostate cancer, who was in his usual state of health till this morning when he was mowing his lawn. And, then when he stood up from the lawnmower he felt a sharp pain in the right side of his head and then he passed out. He thinks he may have had chest pain, and shortness of breath, but he's not sure. He is alert, but more short of breath than usual, but denies any chest pain currently. Also, had some nausea and vomiting while he was in the emergency department. He did feel dizzy earlier today. He was found to be hypoxic in the ED, with saturations in the 70s and early 80s. He denies using oxygen at home. Headache is better but still persists. He is a poor historian.  Home Medications: Prior to Admission medications   Medication Sig Start Date End Date Taking? Authorizing Provider  amLODipine (NORVASC) 10 MG tablet Take 10 mg by mouth daily.   Yes Historical Provider, MD  Diclofenac-Misoprostol (ARTHROTEC) 75-0.2 MG TBEC Take 1 tablet by mouth 2 (two) times daily.   Yes Historical Provider, MD  fexofenadine (ALLEGRA) 180 MG tablet Take 180 mg by mouth daily.   Yes Historical Provider, MD  gabapentin (NEURONTIN) 300 MG capsule Take 300 mg by mouth 3 (three) times daily.   Yes Historical Provider, MD  lisinopril (PRINIVIL,ZESTRIL) 20 MG tablet Take 20 mg by mouth daily.   Yes Historical Provider, MD  LORazepam (ATIVAN) 0.5 MG tablet Take 0.5 mg by mouth 2 (two) times daily.   Yes Historical Provider, MD  mometasone [facility]) 220 MCG/INH inhaler Inhale 2 puffs into the lungs at bedtime.   Yes Historical Provider, MD  omeprazole (PRILOSEC) 20 MG capsule Take 20 mg by mouth  daily.   Yes Historical Provider, MD  sertraline (ZOLOFT) 50 MG tablet Take 50 mg by mouth daily.   Yes Historical Provider, MD  simvastatin (ZOCOR) 40 MG tablet Take 40 mg by mouth daily.   Yes Historical Provider, MD    Allergies:  Allergies  Allergen Reactions  . Lidocaine   . Penicillins     Past Medical History: Past Medical History  Diagnosis Date  . Hypertension   . Asthma   . COPD (chronic obstructive pulmonary disease)   . Cancer     prostate  . Cerebral meningioma   . Renal disorder     kidney stones    Past Surgical History  Procedure Laterality Date  . Brain surgery    . Prostatectomy    . Kidney stone surgery      Social History:  reports that he has never smoked. He does not have any smokeless tobacco history on file. He reports that he does not drink alcohol or use illicit drugs.  Living Situation: Lives alone Activity Level: Independent with daily activities   Family History:  Family History  Problem Relation Age of Onset  . Heart disease Brother      Review of Systems - History obtained from the patient General ROS: positive for  - fatigue Psychological ROS: negative Ophthalmic ROS: negative ENT ROS: negative Allergy and Immunology ROS: negative Hematological and Lymphatic ROS: negative Endocrine ROS: negative Respiratory  ROS: as in hpi Cardiovascular ROS: as in hpi Gastrointestinal ROS: no abdominal pain, change in bowel habits, or black or bloody stools Genito-Urinary ROS: no dysuria, trouble voiding, or hematuria Musculoskeletal ROS: negative Neurological ROS: positive for - headaches Dermatological ROS: negative  Physical Examination  Filed Vitals:   03/12/13 2210 03/13/13 0000 03/13/13 0012 03/13/13 0100  BP: 216/111 132/73 132/73 146/74  Pulse:  73 71 63  Temp:    97.3 F (36.3 C)  TempSrc:    Oral  Resp:  18 17 18   Height:      Weight:      SpO2:  94% 97% 97%    General appearance: alert, cooperative, appears stated  age, no distress and moderately obese Head: Normocephalic, without obvious abnormality, atraumatic Eyes: conjunctivae/corneas clear. PERRL, EOM's intact.  Throat: lips, mucosa, and tongue normal; teeth and gums normal Neck: no adenopathy, no carotid bruit, no JVD, supple, symmetrical, trachea midline and thyroid not enlarged, symmetric, no tenderness/mass/nodules Resp: Few wheezes bilaterally, but overall poor air entry. No crackles. Cardio: regular rate and rhythm, S1, S2 normal, no murmur, click, rub or gallop GI: soft, non-tender; bowel sounds normal; no masses,  no organomegaly Extremities: Minimal pedal edema noted bilaterally. No erythema. No calf tenderness. Pulses: 2+ and symmetric Skin: Skin color, texture, turgor normal. No rashes or lesions Neurologic: He is alert and oriented x3. No focal neurological deficits are present.  Laboratory Data: Results for orders placed during the hospital encounter of 03/12/13 (from the past 48 hour(s))  GLUCOSE, CAPILLARY     Status: Abnormal   Collection Time    03/12/13  4:23 PM      Result Value Range   Glucose-Capillary 108 (*) 70 - 99 mg/dL   Comment 1 Documented in Chart     Comment 2 Notify RN    BASIC METABOLIC PANEL     Status: Abnormal   Collection Time    03/12/13  5:38 PM      Result Value Range   Sodium 139  135 - 145 mEq/L   Potassium 4.0  3.5 - 5.1 mEq/L   Chloride 104  96 - 112 mEq/L   CO2 28  19 - 32 mEq/L   Glucose, Bld 119 (*) 70 - 99 mg/dL   BUN 17  6 - 23 mg/dL   Creatinine, Ser 1.02  0.50 - 1.35 mg/dL   Calcium 9.0  8.4 - 72.5 mg/dL   GFR calc non Af Amer 58 (*) >90 mL/min   GFR calc Af Amer 67 (*) >90 mL/min   Comment: (NOTE)     The eGFR has been calculated using the CKD EPI equation.     This calculation has not been validated in all clinical situations.     eGFR's persistently <90 mL/min signify possible Chronic Kidney     Disease.  CBC WITH DIFFERENTIAL     Status: Abnormal   Collection Time    03/12/13   5:38 PM      Result Value Range   WBC 5.3  4.0 - 10.5 K/uL   RBC 4.31  4.22 - 5.81 MIL/uL   Hemoglobin 12.2 (*) 13.0 - 17.0 g/dL   HCT 36.6 (*) 44.0 - 34.7 %   MCV 87.7  78.0 - 100.0 fL   MCH 28.3  26.0 - 34.0 pg   MCHC 32.3  30.0 - 36.0 g/dL   RDW 42.5  95.6 - 38.7 %   Platelets 144 (*) 150 - 400 K/uL  Neutrophils Relative % 70  43 - 77 %   Neutro Abs 3.7  1.7 - 7.7 K/uL   Lymphocytes Relative 20  12 - 46 %   Lymphs Abs 1.1  0.7 - 4.0 K/uL   Monocytes Relative 7  3 - 12 %   Monocytes Absolute 0.4  0.1 - 1.0 K/uL   Eosinophils Relative 2  0 - 5 %   Eosinophils Absolute 0.1  0.0 - 0.7 K/uL   Basophils Relative 1  0 - 1 %   Basophils Absolute 0.1  0.0 - 0.1 K/uL  TROPONIN I     Status: None   Collection Time    03/12/13  5:38 PM      Result Value Range   Troponin I <0.30  <0.30 ng/mL   Comment:            Due to the release kinetics of cTnI,     a negative result within the first hours     of the onset of symptoms does not rule out     myocardial infarction with certainty.     If myocardial infarction is still suspected,     repeat the test at appropriate intervals.  D-DIMER, QUANTITATIVE     Status: Abnormal   Collection Time    03/12/13 10:45 PM      Result Value Range   D-Dimer, Quant 1.02 (*) 0.00 - 0.48 ug/mL-FEU   Comment:            AT THE INHOUSE ESTABLISHED CUTOFF     VALUE OF 0.48 ug/mL FEU,     THIS ASSAY HAS BEEN DOCUMENTED     IN THE LITERATURE TO HAVE     A SENSITIVITY AND NEGATIVE     PREDICTIVE VALUE OF AT LEAST     98 TO 99%.  THE TEST RESULT     SHOULD BE CORRELATED WITH     AN ASSESSMENT OF THE CLINICAL     PROBABILITY OF DVT / VTE.    Radiology Reports: Dg Chest 2 View  03/12/2013   CLINICAL DATA:  Loss of consciousness. Fall.  EXAM: CHEST  2 VIEW  COMPARISON:  11/01/2012  FINDINGS: No cardiomegaly when accounting for pericardial fat pad. Unchanged upper mediastinal contours.  No change from prior to suggest acute infiltrate, edema, effusion, or  pneumothorax. No acute osseous findings. Bilateral glenohumeral osteoarthritis.  IMPRESSION: No active cardiopulmonary disease.   Electronically Signed   By: Tiburcio Pea   On: 03/12/2013 23:13   Dg Clavicle Right  03/12/2013   CLINICAL DATA:  Fall with right clavicular pain.  EXAM: RIGHT CLAVICLE - 2+ VIEWS  COMPARISON:  None.  FINDINGS: There is no evidence of fracture or other focal bone lesions. The [facility] joint shows normal alignment. Soft tissues are unremarkable.  IMPRESSION: Normal right clavicle.   Electronically Signed   By: Irish Lack   On: 03/12/2013 17:22   Ct Head Wo Contrast  03/12/2013   *RADIOLOGY REPORT*  Clinical Data:  Syncope.  Altered level of consciousness.  CT HEAD WITHOUT CONTRAST CT CERVICAL SPINE WITHOUT CONTRAST  Technique:  Multidetector CT imaging of the head and cervical spine was performed following the standard protocol without intravenous contrast.  Multiplanar CT image reconstructions of the cervical spine were also generated.  Comparison:  09/22/2010  CT HEAD  Findings: A lateral right frontal craniotomy defect is noted.  The previously noted meningioma has been resected. No mass effect, midline shift, or acute intracranial  hemorrhage.  Patchy chronic ischemic changes in the periventricular white matter are present. Moderate global atrophy.  Mastoid air cells are clear.  Visualized paranasal sinuses are clear.  No evidence of acute skull fracture.  IMPRESSION: No acute intracranial pathology.  Postoperative changes.  CT CERVICAL SPINE  Findings: No acute fracture and no dislocation.  Scattered degenerative changes throughout the cervical spine are noted.  Advanced right foraminal narrowing at C3-4.  Mild left foraminal narrowing at C4-5.  Posterior disc osteophytes encroach upon the central canal at C5-6 and there is bilateral foraminal narrowing left greater than right.  Near narrowing at C6-7 without significant central stenosis. Uncovertebral osteophytes cause minimal  foraminal narrowing bilaterally.  No obvious spinal hematoma or large disc herniation.  No obvious soft tissue injury.  IMPRESSION: No evidence of acute bony injury in the cervical spine.  Scattered degenerative changes are noted.   Original Report Authenticated By: Jolaine Click, M.D.   Ct Cervical Spine Wo Contrast  03/12/2013   *RADIOLOGY REPORT*  Clinical Data:  Syncope.  Altered level of consciousness.  CT HEAD WITHOUT CONTRAST CT CERVICAL SPINE WITHOUT CONTRAST  Technique:  Multidetector CT imaging of the head and cervical spine was performed following the standard protocol without intravenous contrast.  Multiplanar CT image reconstructions of the cervical spine were also generated.  Comparison:  09/22/2010  CT HEAD  Findings: A lateral right frontal craniotomy defect is noted.  The previously noted meningioma has been resected. No mass effect, midline shift, or acute intracranial hemorrhage.  Patchy chronic ischemic changes in the periventricular white matter are present. Moderate global atrophy.  Mastoid air cells are clear.  Visualized paranasal sinuses are clear.  No evidence of acute skull fracture.  IMPRESSION: No acute intracranial pathology.  Postoperative changes.  CT CERVICAL SPINE  Findings: No acute fracture and no dislocation.  Scattered degenerative changes throughout the cervical spine are noted.  Advanced right foraminal narrowing at C3-4.  Mild left foraminal narrowing at C4-5.  Posterior disc osteophytes encroach upon the central canal at C5-6 and there is bilateral foraminal narrowing left greater than right.  Near narrowing at C6-7 without significant central stenosis. Uncovertebral osteophytes cause minimal foraminal narrowing bilaterally.  No obvious spinal hematoma or large disc herniation.  No obvious soft tissue injury.  IMPRESSION: No evidence of acute bony injury in the cervical spine.  Scattered degenerative changes are noted.   Original Report Authenticated By: Jolaine Click, M.D.     Electrocardiogram: Sinus rhythm in the 70s. Normal axis. Intervals are normal. No Q waves. No concerning ST or T-wave changes are noted.  Problem List  Principal Problem:   Syncope Active Problems:   COPD (chronic obstructive pulmonary disease)   COPD exacerbation   HTN (hypertension)   Accelerated hypertension   Hypoxia   Assessment: This is a 77 year old, Caucasian male, who presents after a syncopal episode. Etiology is unclear. EKG does not show any ischemic finding. D-dimer is elevated. May have had something to do with the acute headache, although CT head is unremarkable.  Plan: #1 syncope: He'll monitor on telemetry. Troponins will be trended. CT angiography chest will be obtained. Echo and carotid Dopplers will be obtained.  #2 COPD with mild exacerbation: He'll be given steroids, antibiotics, and breathing treatments.  #3 hypoxia: Most likely related to COPD, but we will rule out pulmonary embolism. Chest x-ray does not show any acute infiltrates. We'll need to repeat room air oxygen saturations after he has been treated for COPD.  #4 accelerated  hypertension: Blood pressure has improved with clonidine. Could have been related to acute pain. Continue to monitor for now. Resume his oral agents.  #5 headache: He does have a history of meningioma and has had brain surgery in the past. CT head, however, looks fine. His headache seems to be better. He may require further workup if headaches do not improve or if they get worse.   DVT Prophylaxis: Lovenox Code Status: Full code Family Communication: Discussed with the patient  Disposition Plan: Admit to telemetry   Further management decisions will depend on results of further testing and patient's response to treatment.  [facility]  [facility] Pager [phone]  If 7PM-7AM, please contact night-coverage [email or web address] Password [email or web address]  03/13/2013, 1:11 AM

## 2013-03-13 NOTE — Care Management Note (Unsigned)
    Page 1 of 1   03/13/2013     1:53:26 PM   CARE MANAGEMENT NOTE 03/13/2013  Patient:  Harold York, Harold York   Account Number:  [id number]  Date Initiated:  03/13/2013  Documentation initiated by:  Rosemary Holms  Subjective/Objective Assessment:   pt lives alone and depends on friends. Will follow for anticipated needs     Action/Plan:   Anticipated DC Date:     Anticipated DC Plan:        DC Planning Services  CM consult      Choice offered to / List presented to:             Status of service:  In process, will continue to follow Medicare Important Message given?   (If response is "NO", the following Medicare IM given date fields will be blank) Date Medicare IM given:   Date Additional Medicare IM given:    Discharge Disposition:    Per UR Regulation:    If discussed at Long Length of Stay Meetings, dates discussed:    Comments:  03/13/13 Rosemary Holms RN BSN CM

## 2013-03-15 ENCOUNTER — Ambulatory Visit (HOSPITAL_COMMUNITY)
Admit: 2013-03-15 | Discharge: 2013-03-15 | Disposition: A | Discharge status: Home / Self Care | Payer: Medicare Other | Source: Ambulatory Visit | Attending: Internal Medicine | Admitting: Internal Medicine

## 2013-03-15 DIAGNOSIS — J4489 Other specified chronic obstructive pulmonary disease: Secondary | ICD-10-CM | POA: Insufficient documentation

## 2013-03-15 DIAGNOSIS — R079 Chest pain, unspecified: Secondary | ICD-10-CM | POA: Insufficient documentation

## 2013-03-15 DIAGNOSIS — E669 Obesity, unspecified: Secondary | ICD-10-CM | POA: Diagnosis not present

## 2013-03-15 DIAGNOSIS — I517 Cardiomegaly: Secondary | ICD-10-CM

## 2013-03-15 DIAGNOSIS — Z6837 Body mass index (BMI) 37.0-37.9, adult: Secondary | ICD-10-CM | POA: Diagnosis not present

## 2013-03-15 DIAGNOSIS — J449 Chronic obstructive pulmonary disease, unspecified: Secondary | ICD-10-CM | POA: Insufficient documentation

## 2013-03-15 DIAGNOSIS — I10 Essential (primary) hypertension: Secondary | ICD-10-CM | POA: Insufficient documentation

## 2013-03-15 NOTE — Progress Notes (Signed)
*  PRELIMINARY RESULTS* Echocardiogram 2D Echocardiogram has been performed.  Harold York 03/15/2013, 10:43 AM

## 2013-03-16 DIAGNOSIS — R55 Syncope and collapse: Secondary | ICD-10-CM | POA: Diagnosis not present

## 2013-03-18 DIAGNOSIS — J449 Chronic obstructive pulmonary disease, unspecified: Secondary | ICD-10-CM | POA: Diagnosis not present

## 2013-03-18 DIAGNOSIS — R0609 Other forms of dyspnea: Secondary | ICD-10-CM | POA: Diagnosis not present

## 2013-03-18 DIAGNOSIS — D494 Neoplasm of unspecified behavior of bladder: Secondary | ICD-10-CM | POA: Diagnosis not present

## 2013-03-18 DIAGNOSIS — Z79899 Other long term (current) drug therapy: Secondary | ICD-10-CM | POA: Diagnosis not present

## 2013-03-18 DIAGNOSIS — Z880 Allergy status to penicillin: Secondary | ICD-10-CM | POA: Diagnosis not present

## 2013-03-18 DIAGNOSIS — E669 Obesity, unspecified: Secondary | ICD-10-CM | POA: Diagnosis not present

## 2013-03-18 DIAGNOSIS — R0989 Other specified symptoms and signs involving the circulatory and respiratory systems: Secondary | ICD-10-CM | POA: Diagnosis not present

## 2013-03-18 DIAGNOSIS — Z884 Allergy status to anesthetic agent status: Secondary | ICD-10-CM | POA: Diagnosis not present

## 2013-03-18 DIAGNOSIS — R110 Nausea: Secondary | ICD-10-CM | POA: Diagnosis not present

## 2013-03-18 DIAGNOSIS — Z8546 Personal history of malignant neoplasm of prostate: Secondary | ICD-10-CM | POA: Diagnosis not present

## 2013-03-18 DIAGNOSIS — K449 Diaphragmatic hernia without obstruction or gangrene: Secondary | ICD-10-CM | POA: Diagnosis not present

## 2013-03-18 DIAGNOSIS — Z87891 Personal history of nicotine dependence: Secondary | ICD-10-CM | POA: Diagnosis not present

## 2013-03-18 DIAGNOSIS — R51 Headache: Secondary | ICD-10-CM | POA: Diagnosis not present

## 2013-03-18 DIAGNOSIS — E785 Hyperlipidemia, unspecified: Secondary | ICD-10-CM | POA: Diagnosis not present

## 2013-03-18 DIAGNOSIS — R079 Chest pain, unspecified: Secondary | ICD-10-CM | POA: Diagnosis not present

## 2013-03-18 DIAGNOSIS — Z6836 Body mass index (BMI) 36.0-36.9, adult: Secondary | ICD-10-CM | POA: Diagnosis not present

## 2013-03-18 DIAGNOSIS — I10 Essential (primary) hypertension: Secondary | ICD-10-CM | POA: Diagnosis not present

## 2013-03-18 DIAGNOSIS — M1990 Unspecified osteoarthritis, unspecified site: Secondary | ICD-10-CM | POA: Diagnosis not present

## 2013-03-21 DIAGNOSIS — I519 Heart disease, unspecified: Secondary | ICD-10-CM | POA: Diagnosis not present

## 2013-03-22 ENCOUNTER — Encounter: Payer: Self-pay | Admitting: Cardiology

## 2013-03-22 DIAGNOSIS — R0789 Other chest pain: Secondary | ICD-10-CM | POA: Diagnosis not present

## 2013-03-22 DIAGNOSIS — R51 Headache: Secondary | ICD-10-CM | POA: Diagnosis not present

## 2013-03-22 DIAGNOSIS — R079 Chest pain, unspecified: Secondary | ICD-10-CM | POA: Diagnosis not present

## 2013-03-22 DIAGNOSIS — R5381 Other malaise: Secondary | ICD-10-CM | POA: Diagnosis not present

## 2013-04-03 DIAGNOSIS — I209 Angina pectoris, unspecified: Secondary | ICD-10-CM | POA: Diagnosis not present

## 2013-04-04 ENCOUNTER — Encounter: Payer: Self-pay | Admitting: Cardiology

## 2013-04-04 ENCOUNTER — Telehealth: Payer: Self-pay | Admitting: Cardiology

## 2013-04-04 ENCOUNTER — Encounter (HOSPITAL_COMMUNITY): Payer: Self-pay | Admitting: Pharmacy Technician

## 2013-04-04 ENCOUNTER — Other Ambulatory Visit: Payer: Self-pay | Admitting: Cardiology

## 2013-04-04 ENCOUNTER — Ambulatory Visit (INDEPENDENT_AMBULATORY_CARE_PROVIDER_SITE_OTHER): Payer: Medicare Other | Admitting: Cardiology

## 2013-04-04 ENCOUNTER — Encounter: Payer: Self-pay | Admitting: *Deleted

## 2013-04-04 VITALS — BP 146/90 | HR 80 | Ht 70.0 in | Wt 263.0 lb

## 2013-04-04 DIAGNOSIS — R9439 Abnormal result of other cardiovascular function study: Secondary | ICD-10-CM

## 2013-04-04 DIAGNOSIS — I200 Unstable angina: Secondary | ICD-10-CM | POA: Diagnosis not present

## 2013-04-04 DIAGNOSIS — I10 Essential (primary) hypertension: Secondary | ICD-10-CM

## 2013-04-04 DIAGNOSIS — E782 Mixed hyperlipidemia: Secondary | ICD-10-CM | POA: Diagnosis not present

## 2013-04-04 DIAGNOSIS — J449 Chronic obstructive pulmonary disease, unspecified: Secondary | ICD-10-CM

## 2013-04-04 DIAGNOSIS — R0602 Shortness of breath: Secondary | ICD-10-CM

## 2013-04-04 DIAGNOSIS — I2510 Atherosclerotic heart disease of native coronary artery without angina pectoris: Secondary | ICD-10-CM | POA: Insufficient documentation

## 2013-04-04 MED ORDER — ASPIRIN EC 325 MG PO TBEC: 325.0000 mg | DELAYED_RELEASE_TABLET | Freq: Every day | ORAL | Status: DC

## 2013-04-04 NOTE — Assessment & Plan Note (Signed)
History concerning for accelerating angina with description of progressive exertional fatigue and shortness of breath, also chest tightness. Recent Myoview results show an anterior defect, somewhat equivocal for ischemia versus attenuation based on report. LVEF preserved by echocardiography. Cardiac risk factors include age and gender, hypertension, hyperlipidemia. ECG at rest is nonspecific. He reports a cardiac catheterization approximately 15 years ago that was reassuring. At this point plan is to schedule a repeat cardiac catheterization in light of his progressive symptomatology and ongoing risk factors to clearly assess coronary anatomy and evaluate for revascularization options. The risks and benefits were discussed with the patient and his family, and they plan to stay here in town as he undergoes his testing. Baseline lab work will be repeated. Recent chest x-ray at [facility] was nonacute.

## 2013-04-04 NOTE — Assessment & Plan Note (Signed)
Reports fairly long-standing history. Medical regimen reviewed.

## 2013-04-04 NOTE — Patient Instructions (Addendum)
Your physician has recommended you make the following change in your medication: Start aspirin 325 mg daily. All other medications will remain the same. Your physician has requested that you have a cardiac catheterization tomorrow. Cardiac catheterization is used to diagnose and/or treat various heart conditions. Doctors may recommend this procedure for a number of different reasons. The most common reason is to evaluate chest pain. Chest pain can be a symptom of coronary artery disease (CAD), and cardiac catheterization can show whether plaque is narrowing or blocking your heart's arteries. This procedure is also used to evaluate the valves, as well as measure the blood flow and oxygen levels in different parts of your heart. For further information please visit [email or web address] Please follow instruction sheet, as given.

## 2013-04-04 NOTE — Progress Notes (Signed)
Clinical Summary Mr. Schetter is a 77 y.o.male referred for cardiology consultation by Dr. Olena Leatherwood. Records were reviewed. He is here with his son and daughter-in-lawn who are currently visiting from [place]. Patient reports a progressive history of exertional fatigue and breathlessness, chest tightness with radiation to the gums, and arm fatigue over the last few months at least. Within this time frame he has had one episode of syncope that occurred in the setting of fatigue while he was mowing his lawn, after standing up from a riding mower. He also reports having intermittent headaches, not necessarily associated with these other symptoms. I note that he has a prior history of meningioma resection in 2012..  Echocardiogram from September of this year showed mild LVH with LVEF 60-65%, no wall motion abnormalities, grade 1 diastolic dysfunction, trivial mitral regurgitation, mild left atrial enlargement, RV-RA gradient 21 mm mercury. Lexiscan Cardiolite ordered by Dr. Olena Leatherwood and performed in September revealed a moderate-sized anterior wall defect potentially indicative of soft tissue attenuation with some equivication, LVEF 61%. Report was read by Dr. Bradly Chris. Recent carotid Doppler showed less than 50% bilateral ICA stenoses.  ECG reviewed showing sinus rhythm with nonspecific T-wave changes.  Patient indicates that the above symptoms have been getting worse. I reviewed the risks and benefits of a diagnostic cardiac catheterization with the patient his family, to best understand his coronary anatomy and assess for revascularization options. He reports that he had a heart catheterization approximately 15 years ago and had no clear evidence of blockages at that time. I do not have these records for review.   Allergies  Allergen Reactions  . Lidocaine   . Penicillins     Current Outpatient Prescriptions  Medication Sig Dispense Refill  . albuterol (PROVENTIL HFA;VENTOLIN HFA) 108 (90  BASE) MCG/ACT inhaler Inhale 2 puffs into the lungs every 6 (six) hours as needed for wheezing.  1 Inhaler  2  . amLODipine (NORVASC) 10 MG tablet Take 10 mg by mouth daily.      . Diclofenac-Misoprostol (ARTHROTEC) 75-0.2 MG TBEC Take 1 tablet by mouth 2 (two) times daily.      . fish oil-omega-3 fatty acids 1000 MG capsule Take 2 g by mouth daily.      . formoterol (FORADIL) 12 MCG capsule for inhaler Place 12 mcg into inhaler and inhale 2 (two) times daily.      Marland Kitchen gabapentin (NEURONTIN) 300 MG capsule Take 300 mg by mouth 3 (three) times daily.      Marland Kitchen LORazepam (ATIVAN) 0.5 MG tablet Take 0.5 mg by mouth daily.       Marland Kitchen omeprazole (PRILOSEC) 20 MG capsule Take 20 mg by mouth daily.      . sertraline (ZOLOFT) 50 MG tablet Take 50 mg by mouth daily.      Marland Kitchen tiotropium (SPIRIVA) 18 MCG inhalation capsule Place 18 mcg into inhaler and inhale daily.      Marland Kitchen aspirin EC 325 MG tablet Take 1 tablet (325 mg total) by mouth daily.  30 tablet  0  . lisinopril (PRINIVIL,ZESTRIL) 20 MG tablet Take 20 mg by mouth daily.       No current facility-administered medications for this visit.    Past Medical History  Diagnosis Date  . Essential hypertension, benign   . Asthma   . COPD (chronic obstructive pulmonary disease)   . Prostate cancer   . Cerebral meningioma   . Nephrolithiasis   . Mixed hyperlipidemia     Past Surgical History  Procedure Laterality Date  . Craniotomy for stereotactic guided surgery  07/2010    Meningioma resection  . Prostatectomy    . Kidney stone surgery      Family History  Problem Relation Age of Onset  . Heart disease Brother     Social History Mr. More reports that he quit smoking about 25 years ago. His smoking use included Cigarettes. He has a 140 pack-year smoking history. He does not have any smokeless tobacco history on file. Mr. Kapur reports that he does not drink alcohol.  Review of Systems No palpitations. No reported bleeding episodes except  prior history of hematuria associated with nephrolithiasis. Stable appetite. Otherwise as outlined above.  Physical Examination Filed Vitals:   04/04/13 1144  BP: 146/90  Pulse: 80   Filed Weights   04/04/13 1144  Weight: 263 lb (119.296 kg)   Obese male, no acute distress. HEENT: Conjunctiva and lids normal, oropharynx clear. Neck: Supple, increased girth, no carotid bruits, no thyromegaly. Lungs: Diminished but clear to auscultation, nonlabored breathing at rest. Cardiac: Regular rate and rhythm, no S3 or significant systolic murmur, no pericardial rub. Abdomen: Soft, nontender, protuberant, bowel sounds present, no guarding or rebound. Extremities: 1-2+ edema, distal pulses 2+. Skin: Warm and dry. Musculoskeletal: No kyphosis. Neuropsychiatric: Alert and oriented x3, affect grossly appropriate.   Problem List and Plan   Accelerating angina History concerning for accelerating angina with description of progressive exertional fatigue and shortness of breath, also chest tightness. Recent Myoview results show an anterior defect, somewhat equivocal for ischemia versus attenuation based on report. LVEF preserved by echocardiography. Cardiac risk factors include age and gender, hypertension, hyperlipidemia. ECG at rest is nonspecific. He reports a cardiac catheterization approximately 15 years ago that was reassuring. At this point plan is to schedule a repeat cardiac catheterization in light of his progressive symptomatology and ongoing risk factors to clearly assess coronary anatomy and evaluate for revascularization options. The risks and benefits were discussed with the patient and his family, and they plan to stay here in town as he undergoes his testing. Baseline lab work will be repeated. Recent chest x-[facility] at [facility] was nonacute.  Essential hypertension, benign Reports fairly long-standing history. Medical regimen reviewed.  Mixed hyperlipidemia This is been followed by Dr.  Olena Leatherwood. Patient is currently not on statin therapy. This will need to be addressed with repeat lab work.  COPD (chronic obstructive pulmonary disease) Patient uses Proventil, Spiriva, and Foradil, currently not on beta blocker.    Jonelle Sidle, M.D., F.A.C.C.

## 2013-04-04 NOTE — Assessment & Plan Note (Signed)
Patient uses Proventil, Spiriva, and Foradil, currently not on beta blocker.

## 2013-04-04 NOTE — Telephone Encounter (Signed)
Message copied by Geraldine Contras on Wed Apr 04, 2013  2:02 PM ------      Message from: Britt Bolognese      Created: Wed Apr 04, 2013  1:53 PM      Regarding: RE: CATH SCHEDULED 10/9       Pt has medicare and tricare, no precert required      ----- Message -----         From: Geraldine Contras         Sent: 04/04/2013   1:24 PM           To: Jeannetta Nap      Subject: CATH SCHEDULED 10/9                                      Left heart cath main lab at [facility] on Thursday, 04/05/13 @2 :30 pm with Dr. Excell Seltzer       ------

## 2013-04-04 NOTE — Assessment & Plan Note (Signed)
This is been followed by Dr. Olena Leatherwood. Patient is currently not on statin therapy. This will need to be addressed with repeat lab work.

## 2013-04-05 MED ORDER — SODIUM CHLORIDE 0.9 % IV SOLN: 250.0000 mL | INTRAVENOUS | Status: DC | PRN

## 2013-04-05 MED ORDER — SODIUM CHLORIDE 0.9 % IJ SOLN: 3.0000 mL | Freq: Two times a day (BID) | INTRAMUSCULAR | Status: DC

## 2013-04-05 MED ORDER — SODIUM CHLORIDE 0.9 % IV SOLN
INTRAVENOUS | Status: DC
  Administered 2013-04-06: 11:00:00 via INTRAVENOUS

## 2013-04-05 MED ORDER — ASPIRIN 81 MG PO CHEW
81.0000 mg | CHEWABLE_TABLET | ORAL | Status: DC
Start: 2013-04-06 — End: 2013-04-06
  Filled 2013-04-05: qty 1

## 2013-04-05 MED ORDER — ATORVASTATIN CALCIUM 80 MG PO TABS
80.0000 mg | ORAL_TABLET | Freq: Every day | ORAL | Status: DC
  Filled 2013-04-05: qty 1

## 2013-04-05 MED ORDER — ATORVASTATIN CALCIUM 80 MG PO TABS
80.0000 mg | ORAL_TABLET | ORAL | Status: DC
  Filled 2013-04-05: qty 1

## 2013-04-05 MED ORDER — SODIUM CHLORIDE 0.9 % IJ SOLN: 3.0000 mL | INTRAMUSCULAR | Status: DC | PRN

## 2013-04-06 ENCOUNTER — Ambulatory Visit (HOSPITAL_COMMUNITY)
Admission: RE | Admit: 2013-04-06 | Discharge: 2013-04-07 | Disposition: A | Discharge status: Home / Self Care | Payer: Medicare Other | Source: Ambulatory Visit | Attending: Cardiovascular Disease | Admitting: Cardiovascular Disease

## 2013-04-06 ENCOUNTER — Encounter (HOSPITAL_COMMUNITY): Payer: Self-pay | Admitting: General Practice

## 2013-04-06 ENCOUNTER — Encounter (HOSPITAL_COMMUNITY)
Admission: RE | Disposition: A | Discharge status: Home / Self Care | Payer: Self-pay | Source: Ambulatory Visit | Attending: Cardiovascular Disease

## 2013-04-06 DIAGNOSIS — Z7902 Long term (current) use of antithrombotics/antiplatelets: Secondary | ICD-10-CM | POA: Insufficient documentation

## 2013-04-06 DIAGNOSIS — R9439 Abnormal result of other cardiovascular function study: Secondary | ICD-10-CM

## 2013-04-06 DIAGNOSIS — I10 Essential (primary) hypertension: Secondary | ICD-10-CM | POA: Diagnosis present

## 2013-04-06 DIAGNOSIS — Z8546 Personal history of malignant neoplasm of prostate: Secondary | ICD-10-CM | POA: Insufficient documentation

## 2013-04-06 DIAGNOSIS — I200 Unstable angina: Secondary | ICD-10-CM | POA: Diagnosis not present

## 2013-04-06 DIAGNOSIS — Z79899 Other long term (current) drug therapy: Secondary | ICD-10-CM | POA: Diagnosis not present

## 2013-04-06 DIAGNOSIS — Z23 Encounter for immunization: Secondary | ICD-10-CM | POA: Insufficient documentation

## 2013-04-06 DIAGNOSIS — I2510 Atherosclerotic heart disease of native coronary artery without angina pectoris: Secondary | ICD-10-CM

## 2013-04-06 DIAGNOSIS — J449 Chronic obstructive pulmonary disease, unspecified: Secondary | ICD-10-CM | POA: Insufficient documentation

## 2013-04-06 DIAGNOSIS — E782 Mixed hyperlipidemia: Secondary | ICD-10-CM | POA: Diagnosis not present

## 2013-04-06 DIAGNOSIS — J4489 Other specified chronic obstructive pulmonary disease: Secondary | ICD-10-CM | POA: Insufficient documentation

## 2013-04-06 HISTORY — DX: Gastro-esophageal reflux disease without esophagitis: K21.9

## 2013-04-06 HISTORY — PX: PERCUTANEOUS CORONARY STENT INTERVENTION (PCI-S): SHX5485

## 2013-04-06 HISTORY — DX: Unspecified osteoarthritis, unspecified site: M19.90

## 2013-04-06 HISTORY — PX: LEFT HEART CATHETERIZATION WITH CORONARY ANGIOGRAM: SHX5451

## 2013-04-06 HISTORY — DX: Sleep apnea, unspecified: G47.30

## 2013-04-06 HISTORY — DX: Malignant neoplasm of bladder, unspecified: C67.9

## 2013-04-06 LAB — BASIC METABOLIC PANEL
BUN: 21 mg/dL (ref 6–23)
CO2: 26 mEq/L (ref 19–32)
Calcium: 9.5 mg/dL (ref 8.4–10.5)
Creatinine, Ser: 1.1 mg/dL (ref 0.50–1.35)
GFR calc non Af Amer: 64 mL/min — ABNORMAL LOW (ref >90)
Glucose, Bld: 108 mg/dL — ABNORMAL HIGH (ref 70–99)

## 2013-04-06 LAB — CBC
MCH: 28.3 pg (ref 26.0–34.0)
MCHC: 32.1 g/dL (ref 30.0–36.0)
MCV: 88.1 fL (ref 78.0–100.0)
Platelets: 165 10*3/uL (ref 150–400)
RDW: 15.5 % (ref 11.5–15.5)

## 2013-04-06 LAB — PROTIME-INR: Prothrombin Time: 13.5 seconds (ref 11.6–15.2)

## 2013-04-06 LAB — POCT ACTIVATED CLOTTING TIME: Activated Clotting Time: 242 seconds

## 2013-04-06 SURGERY — LEFT HEART CATHETERIZATION WITH CORONARY ANGIOGRAM
Anesthesia: LOCAL

## 2013-04-06 MED ORDER — VERAPAMIL HCL 2.5 MG/ML IV SOLN
INTRAVENOUS | Status: AC
  Filled 2013-04-06: qty 2

## 2013-04-06 MED ORDER — SALMETEROL XINAFOATE 50 MCG/DOSE IN AEPB
1.0000 | INHALATION_SPRAY | Freq: Two times a day (BID) | RESPIRATORY_TRACT | Status: DC
  Administered 2013-04-06 – 2013-04-07 (×2): 1 via RESPIRATORY_TRACT
  Filled 2013-04-06: qty 0

## 2013-04-06 MED ORDER — TETRACAINE HCL 1 % IJ SOLN
20.0000 mg | INTRAMUSCULAR | Status: DC
  Filled 2013-04-06: qty 2

## 2013-04-06 MED ORDER — NITROGLYCERIN IN D5W 200-5 MCG/ML-% IV SOLN
INTRAVENOUS | Status: AC
Start: 2013-04-06 — End: 2013-04-06
  Filled 2013-04-06: qty 250

## 2013-04-06 MED ORDER — OXYCODONE-ACETAMINOPHEN 5-325 MG PO TABS: 1.0000 | ORAL_TABLET | ORAL | Status: DC | PRN

## 2013-04-06 MED ORDER — MISOPROSTOL 200 MCG PO TABS
200.0000 ug | ORAL_TABLET | Freq: Two times a day (BID) | ORAL | Status: DC
  Administered 2013-04-06 – 2013-04-07 (×2): 200 ug via ORAL
  Filled 2013-04-06 (×4): qty 1

## 2013-04-06 MED ORDER — DICLOFENAC-MISOPROSTOL 75-0.2 MG PO TBEC: 1.0000 | DELAYED_RELEASE_TABLET | Freq: Two times a day (BID) | ORAL | Status: DC

## 2013-04-06 MED ORDER — NITROGLYCERIN 0.4 MG SL SUBL
0.4000 mg | SUBLINGUAL_TABLET | SUBLINGUAL | Status: DC | PRN
  Administered 2013-04-06 (×3): 0.4 mg via SUBLINGUAL
  Filled 2013-04-06: qty 25

## 2013-04-06 MED ORDER — ONDANSETRON HCL 4 MG/2ML IJ SOLN: 4.0000 mg | Freq: Four times a day (QID) | INTRAMUSCULAR | Status: DC | PRN

## 2013-04-06 MED ORDER — HEPARIN SODIUM (PORCINE) 1000 UNIT/ML IJ SOLN
INTRAMUSCULAR | Status: AC
  Filled 2013-04-06: qty 1

## 2013-04-06 MED ORDER — SODIUM CHLORIDE 0.9 % IV SOLN
INTRAVENOUS | Status: AC
  Administered 2013-04-06: 14:00:00 via INTRAVENOUS

## 2013-04-06 MED ORDER — LORAZEPAM 0.5 MG PO TABS
0.5000 mg | ORAL_TABLET | Freq: Every day | ORAL | Status: DC
Start: 2013-04-07 — End: 2013-04-07

## 2013-04-06 MED ORDER — ONDANSETRON HCL 4 MG/2ML IJ SOLN: 4.0000 mg | Freq: Once | INTRAMUSCULAR | Status: DC

## 2013-04-06 MED ORDER — LISINOPRIL 20 MG PO TABS
20.0000 mg | ORAL_TABLET | Freq: Every day | ORAL | Status: DC
  Administered 2013-04-06 – 2013-04-07 (×2): 20 mg via ORAL
  Filled 2013-04-06 (×2): qty 1

## 2013-04-06 MED ORDER — CLOPIDOGREL BISULFATE 75 MG PO TABS
75.0000 mg | ORAL_TABLET | Freq: Every day | ORAL | Status: DC
  Administered 2013-04-07: 75 mg via ORAL
  Filled 2013-04-06: qty 1

## 2013-04-06 MED ORDER — SERTRALINE HCL 50 MG PO TABS
50.0000 mg | ORAL_TABLET | Freq: Every day | ORAL | Status: DC
  Administered 2013-04-07: 09:00:00 50 mg via ORAL
  Filled 2013-04-06: qty 1

## 2013-04-06 MED ORDER — MIDAZOLAM HCL 2 MG/2ML IJ SOLN
INTRAMUSCULAR | Status: AC
  Filled 2013-04-06: qty 2

## 2013-04-06 MED ORDER — METOPROLOL TARTRATE 25 MG PO TABS
25.0000 mg | ORAL_TABLET | Freq: Two times a day (BID) | ORAL | Status: DC
  Administered 2013-04-06 – 2013-04-07 (×3): 25 mg via ORAL
  Filled 2013-04-06 (×4): qty 1

## 2013-04-06 MED ORDER — TIOTROPIUM BROMIDE MONOHYDRATE 18 MCG IN CAPS
18.0000 ug | ORAL_CAPSULE | Freq: Every day | RESPIRATORY_TRACT | Status: DC
  Administered 2013-04-07: 08:00:00 18 ug via RESPIRATORY_TRACT
  Filled 2013-04-06: qty 5

## 2013-04-06 MED ORDER — GABAPENTIN 300 MG PO CAPS
300.0000 mg | ORAL_CAPSULE | Freq: Three times a day (TID) | ORAL | Status: DC
  Administered 2013-04-06 – 2013-04-07 (×3): 300 mg via ORAL
  Filled 2013-04-06 (×5): qty 1

## 2013-04-06 MED ORDER — AMLODIPINE BESYLATE 10 MG PO TABS
10.0000 mg | ORAL_TABLET | Freq: Every day | ORAL | Status: DC
  Administered 2013-04-07: 10 mg via ORAL
  Filled 2013-04-06 (×2): qty 1

## 2013-04-06 MED ORDER — TETRACAINE HCL 1 % IJ SOLN: 10.0000 mg | Freq: Once | INTRAMUSCULAR | Status: DC

## 2013-04-06 MED ORDER — NITROGLYCERIN 0.2 MG/ML ON CALL CATH LAB
INTRAVENOUS | Status: AC
  Filled 2013-04-06: qty 1

## 2013-04-06 MED ORDER — ACETAMINOPHEN 325 MG PO TABS: 650.0000 mg | ORAL_TABLET | ORAL | Status: DC | PRN

## 2013-04-06 MED ORDER — ASPIRIN 81 MG PO CHEW
81.0000 mg | CHEWABLE_TABLET | Freq: Every day | ORAL | Status: DC
  Administered 2013-04-07: 09:00:00 81 mg via ORAL
  Filled 2013-04-06 (×2): qty 1

## 2013-04-06 MED ORDER — DICLOFENAC SODIUM 75 MG PO TBEC
75.0000 mg | DELAYED_RELEASE_TABLET | Freq: Two times a day (BID) | ORAL | Status: DC
  Administered 2013-04-06 – 2013-04-07 (×2): 75 mg via ORAL
  Filled 2013-04-06 (×4): qty 1

## 2013-04-06 MED ORDER — FENTANYL CITRATE 0.05 MG/ML IJ SOLN
INTRAMUSCULAR | Status: AC
  Filled 2013-04-06: qty 2

## 2013-04-06 MED ORDER — NITROGLYCERIN IN D5W 200-5 MCG/ML-% IV SOLN
2.0000 ug/min | INTRAVENOUS | Status: DC
  Administered 2013-04-06: 20 ug/min via INTRAVENOUS

## 2013-04-06 MED ORDER — HEPARIN (PORCINE) IN NACL 2-0.9 UNIT/ML-% IJ SOLN
INTRAMUSCULAR | Status: AC
  Filled 2013-04-06: qty 1000

## 2013-04-06 MED ORDER — CLOPIDOGREL BISULFATE 300 MG PO TABS
ORAL_TABLET | ORAL | Status: AC
  Filled 2013-04-06: qty 2

## 2013-04-06 MED ORDER — INFLUENZA VAC SPLIT QUAD 0.5 ML IM SUSP
0.5000 mL | INTRAMUSCULAR | Status: AC
  Administered 2013-04-07: 0.5 mL via INTRAMUSCULAR
  Filled 2013-04-06: qty 0.5

## 2013-04-06 MED ORDER — ALBUTEROL SULFATE HFA 108 (90 BASE) MCG/ACT IN AERS
2.0000 | INHALATION_SPRAY | Freq: Four times a day (QID) | RESPIRATORY_TRACT | Status: DC | PRN
  Administered 2013-04-07: 09:00:00 2 via RESPIRATORY_TRACT
  Filled 2013-04-06 (×2): qty 6.7

## 2013-04-06 NOTE — Progress Notes (Signed)
Upon arrival from cath lab, right forearm larger and firmer than left.  Patient states that this is his baseline secondary to heavy physical labor with that arm.  Arm observed and kept elevated during deflation of sheath.  Patient denies any pain in right arm; no margins noted.  Capillary refill normal, no vascular congestion of right hand noted. Asked Si Gaul., PA to look at arm, which he did.  Arm to be kept elevated, ice applied, and nite RN will continue to monitor.

## 2013-04-06 NOTE — Progress Notes (Signed)
CTSP due to CP 5/10. The patient states he's actually been having CP since last night. He did not take anything at home for this. This is the pain he's been having at home intermittently. He was very hypertensive in short stay. Took AM amlodipine already but not ACEI. Presumably not on BB due to COPD. 3 SL NTG brought pain down to a 2/10 and brought pressure down to 153/92. The patient states "it's calming down." ECG nonacute. Have asked nursing to add on a troponin to the bloodwork drawn this AM. Will move patient up on board to be cathed next - awaiting results of BMET/CBC. Dayna Dunn PA-C

## 2013-04-06 NOTE — CV Procedure (Signed)
    Cardiac Catheterization Procedure Note  Name: Harold York MRN: [id number] DOB: Mar 21, 1936  Procedure: Left Heart Cath, Selective Coronary Angiography, LV angiography, PTCA and stenting of the first OM  Indication: 77 year-old with crescendo angina, typical of USAP after waking up this am with resting chest pain. A NTG drip was started on arrival to the cath lab. He has no personal history of CAD.   Procedural Details:  The right wrist was prepped, draped, and anesthetized with 1% lidocaine. Using the modified Seldinger technique, a 5/6 French sheath was introduced into the right radial artery. 3 mg of verapamil was administered through the sheath, weight-based unfractionated heparin was administered intravenously. Standard Judkins catheters were used for selective coronary angiography and left ventriculography. Catheter exchanges were performed over an exchange length guidewire.  PROCEDURAL FINDINGS Hemodynamics: AO 156/93 LV 149/20   Coronary angiography: Coronary dominance: right  Left mainstem: Widely patent, arises from the left cusp, short segment.   Left anterior descending (LAD): Large vessel, courses to the LV apex. No obstructive disease present. Diagonal 1 is large in caliber.   Left circumflex (LCx): Large intermediate branch without obstructive disease. AV circ is patent. The first OM has 80-90% eccentric stenosis over a focal portion of the proximal vessel.   Right coronary artery (RCA): Large dominant vessel. No obstructive disease. PDA and PLA branches are patent.   Left ventriculography: Left ventricular systolic function is vigorous, LVEF is estimated at 65-70%, there is no significant mitral regurgitation   PCI Note:  Following the diagnostic procedure, the decision was made to proceed with PCI. Weight-based heparin was given for anticoagulation. Once a therapeutic ACT was achieved, a 6 [place] XB-LAD 3.5 cm guide catheter was inserted.  A cougar coronary  guidewire was used to cross the lesion.  The lesion was then stented with a 2.5x15 mm Xience DES stent.  The stent was postdilated with a 2.75 mm noncompliant balloon to 16 atm.  Following PCI, there was 0% residual stenosis and TIMI-3 flow. Final angiography confirmed an excellent result. The patient tolerated the procedure well. There were no immediate procedural complications. A TR band was used for radial hemostasis. The patient was transferred to the post catheterization recovery area for further monitoring.  PCI Data: Vessel - OM/Segment - proximal Percent Stenosis (pre)  80 TIMI-flow 3 Stent 2.5x15 mm Xience DES Percent Stenosis (post) 0 TIMI-flow (post) 3  Final Conclusions:   1. 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 Widely patent LAD, left main, and RCA 3. Vigorous LV function   Recommendations:  ASA/plavix x 12 months, aggressive risk reduction.  Tonny Bollman 04/06/2013, 12:44 PM

## 2013-04-06 NOTE — H&P (View-Only) (Signed)
  Clinical Summary Mr. Yin is a 77 y.o.male referred for cardiology consultation by Dr. Hasanaj. Records were reviewed. He is here with his son and daughter-in-lawn who are currently visiting from [place]. Patient reports a progressive history of exertional fatigue and breathlessness, chest tightness with radiation to the gums, and arm fatigue over the last few months at least. Within this time frame he has had one episode of syncope that occurred in the setting of fatigue while he was mowing his lawn, after standing up from a riding mower. He also reports having intermittent headaches, not necessarily associated with these other symptoms. I note that he has a prior history of meningioma resection in 2012..  Echocardiogram from September of this year showed mild LVH with LVEF 60-65%, no wall motion abnormalities, grade 1 diastolic dysfunction, trivial mitral regurgitation, mild left atrial enlargement, RV-RA gradient 21 mm mercury. Lexiscan Cardiolite ordered by Dr. Hasanaj and performed in September revealed a moderate-sized anterior wall defect potentially indicative of soft tissue attenuation with some equivication, LVEF 61%. Report was read by Dr. Stroud. Recent carotid Doppler showed less than 50% bilateral ICA stenoses.  ECG reviewed showing sinus rhythm with nonspecific T-wave changes.  Patient indicates that the above symptoms have been getting worse. I reviewed the risks and benefits of a diagnostic cardiac catheterization with the patient his family, to best understand his coronary anatomy and assess for revascularization options. He reports that he had a heart catheterization approximately 15 years ago and had no clear evidence of blockages at that time. I do not have these records for review.   Allergies  Allergen Reactions  . Lidocaine   . Penicillins     Current Outpatient Prescriptions  Medication Sig Dispense Refill  . albuterol (PROVENTIL HFA;VENTOLIN HFA) 108 (90  BASE) MCG/ACT inhaler Inhale 2 puffs into the lungs every 6 (six) hours as needed for wheezing.  1 Inhaler  2  . amLODipine (NORVASC) 10 MG tablet Take 10 mg by mouth daily.      . Diclofenac-Misoprostol (ARTHROTEC) 75-0.2 MG TBEC Take 1 tablet by mouth 2 (two) times daily.      . fish oil-omega-3 fatty acids 1000 MG capsule Take 2 g by mouth daily.      . formoterol (FORADIL) 12 MCG capsule for inhaler Place 12 mcg into inhaler and inhale 2 (two) times daily.      . gabapentin (NEURONTIN) 300 MG capsule Take 300 mg by mouth 3 (three) times daily.      . LORazepam (ATIVAN) 0.5 MG tablet Take 0.5 mg by mouth daily.       . omeprazole (PRILOSEC) 20 MG capsule Take 20 mg by mouth daily.      . sertraline (ZOLOFT) 50 MG tablet Take 50 mg by mouth daily.      . tiotropium (SPIRIVA) 18 MCG inhalation capsule Place 18 mcg into inhaler and inhale daily.      . aspirin EC 325 MG tablet Take 1 tablet (325 mg total) by mouth daily.  30 tablet  0  . lisinopril (PRINIVIL,ZESTRIL) 20 MG tablet Take 20 mg by mouth daily.       No current facility-administered medications for this visit.    Past Medical History  Diagnosis Date  . Essential hypertension, benign   . Asthma   . COPD (chronic obstructive pulmonary disease)   . Prostate cancer   . Cerebral meningioma   . Nephrolithiasis   . Mixed hyperlipidemia     Past Surgical History    Procedure Laterality Date  . Craniotomy for stereotactic guided surgery  07/2010    Meningioma resection  . Prostatectomy    . Kidney stone surgery      Family History  Problem Relation Age of Onset  . Heart disease Brother     Social History Mr. Bittman reports that he quit smoking about 25 years ago. His smoking use included Cigarettes. He has a 140 pack-year smoking history. He does not have any smokeless tobacco history on file. Mr. Albanese reports that he does not drink alcohol.  Review of Systems No palpitations. No reported bleeding episodes except  prior history of hematuria associated with nephrolithiasis. Stable appetite. Otherwise as outlined above.  Physical Examination Filed Vitals:   04/04/13 1144  BP: 146/90  Pulse: 80   Filed Weights   04/04/13 1144  Weight: 263 lb (119.296 kg)   Obese male, no acute distress. HEENT: Conjunctiva and lids normal, oropharynx clear. Neck: Supple, increased girth, no carotid bruits, no thyromegaly. Lungs: Diminished but clear to auscultation, nonlabored breathing at rest. Cardiac: Regular rate and rhythm, no S3 or significant systolic murmur, no pericardial rub. Abdomen: Soft, nontender, protuberant, bowel sounds present, no guarding or rebound. Extremities: 1-2+ edema, distal pulses 2+. Skin: Warm and dry. Musculoskeletal: No kyphosis. Neuropsychiatric: Alert and oriented x3, affect grossly appropriate.   Problem List and Plan   Accelerating angina History concerning for accelerating angina with description of progressive exertional fatigue and shortness of breath, also chest tightness. Recent Myoview results show an anterior defect, somewhat equivocal for ischemia versus attenuation based on report. LVEF preserved by echocardiography. Cardiac risk factors include age and gender, hypertension, hyperlipidemia. ECG at rest is nonspecific. He reports a cardiac catheterization approximately 15 years ago that was reassuring. At this point plan is to schedule a repeat cardiac catheterization in light of his progressive symptomatology and ongoing risk factors to clearly assess coronary anatomy and evaluate for revascularization options. The risks and benefits were discussed with the patient and his family, and they plan to stay here in town as he undergoes his testing. Baseline lab work will be repeated. Recent chest x-ray at [facility] was nonacute.  Essential hypertension, benign Reports fairly long-standing history. Medical regimen reviewed.  Mixed hyperlipidemia This is been followed by Dr.  Hasanaj. Patient is currently not on statin therapy. This will need to be addressed with repeat lab work.  COPD (chronic obstructive pulmonary disease) Patient uses Proventil, Spiriva, and Foradil, currently not on beta blocker.    Gitel Beste G. Lenix Benoist, M.D., F.A.C.C.   

## 2013-04-06 NOTE — Interval H&P Note (Signed)
History and Physical Interval Note:  04/06/2013 11:28 AM  [name]  has presented today for surgery, with the diagnosis of angina  The various methods of treatment have been discussed with the patient and family. After consideration of risks, benefits and other options for treatment, the patient has consented to  Procedure(s): LEFT HEART CATHETERIZATION WITH CORONARY ANGIOGRAM (N/A) as a surgical intervention .  The patient's history has been reviewed, patient examined, no change in status, stable for surgery.  I have reviewed the patient's chart and labs.  Questions were answered to the patient's satisfaction.    Called to see patient for chest pain. He is having resting 3/10 chest pain. Woke up at 4:30 am with this. Pain has been progressing. Associated nausea/weakness. He is having typical symptoms of unstable angina. Plan urgent cath and possible PCI. Explained to patient, son, and daughter-in-law.  Cath Lab Visit (complete for each Cath Lab visit)  Clinical Evaluation Leading to the Procedure:   ACS: yes  Non-ACS:    Anginal Classification: CCS IV  Anti-ischemic medical therapy: Minimal Therapy (1 class of medications)  Non-Invasive Test Results: Equivocal test results  Prior CABG: No previous CABG        Harold York

## 2013-04-07 DIAGNOSIS — E782 Mixed hyperlipidemia: Secondary | ICD-10-CM | POA: Diagnosis not present

## 2013-04-07 DIAGNOSIS — I10 Essential (primary) hypertension: Secondary | ICD-10-CM | POA: Diagnosis not present

## 2013-04-07 DIAGNOSIS — J449 Chronic obstructive pulmonary disease, unspecified: Secondary | ICD-10-CM | POA: Diagnosis not present

## 2013-04-07 DIAGNOSIS — I200 Unstable angina: Secondary | ICD-10-CM

## 2013-04-07 DIAGNOSIS — R9439 Abnormal result of other cardiovascular function study: Secondary | ICD-10-CM | POA: Diagnosis not present

## 2013-04-07 DIAGNOSIS — Z79899 Other long term (current) drug therapy: Secondary | ICD-10-CM | POA: Diagnosis not present

## 2013-04-07 DIAGNOSIS — Z7902 Long term (current) use of antithrombotics/antiplatelets: Secondary | ICD-10-CM | POA: Diagnosis not present

## 2013-04-07 DIAGNOSIS — I2510 Atherosclerotic heart disease of native coronary artery without angina pectoris: Secondary | ICD-10-CM | POA: Diagnosis not present

## 2013-04-07 LAB — CBC
HCT: 35 % — ABNORMAL LOW (ref 39.0–52.0)
Hemoglobin: 11.6 g/dL — ABNORMAL LOW (ref 13.0–17.0)
MCH: 28.9 pg (ref 26.0–34.0)
MCHC: 33.1 g/dL (ref 30.0–36.0)
MCV: 87.3 fL (ref 78.0–100.0)
RDW: 15.5 % (ref 11.5–15.5)

## 2013-04-07 LAB — BASIC METABOLIC PANEL
BUN: 21 mg/dL (ref 6–23)
Calcium: 8.8 mg/dL (ref 8.4–10.5)
Creatinine, Ser: 1.1 mg/dL (ref 0.50–1.35)
GFR calc Af Amer: 74 mL/min — ABNORMAL LOW (ref >90)
GFR calc non Af Amer: 64 mL/min — ABNORMAL LOW (ref >90)
Potassium: 4.1 mEq/L (ref 3.5–5.1)

## 2013-04-07 MED ORDER — PANTOPRAZOLE SODIUM 40 MG PO TBEC: 40.0000 mg | DELAYED_RELEASE_TABLET | Freq: Every day | ORAL | Status: DC

## 2013-04-07 MED ORDER — CLOPIDOGREL BISULFATE 75 MG PO TABS: 75.0000 mg | ORAL_TABLET | Freq: Every day | ORAL | Status: DC

## 2013-04-07 MED ORDER — PRAVASTATIN SODIUM 40 MG PO TABS: 40.0000 mg | ORAL_TABLET | Freq: Every day | ORAL | Status: DC

## 2013-04-07 MED ORDER — NITROGLYCERIN 0.4 MG SL SUBL: 0.4000 mg | SUBLINGUAL_TABLET | SUBLINGUAL | Status: AC | PRN

## 2013-04-07 MED ORDER — ASPIRIN 81 MG PO TBEC: 81.0000 mg | DELAYED_RELEASE_TABLET | Freq: Every day | ORAL | Status: AC

## 2013-04-07 MED ORDER — NITROGLYCERIN 0.4 MG SL SUBL: 0.4000 mg | SUBLINGUAL_TABLET | SUBLINGUAL | Status: DC | PRN

## 2013-04-07 MED ORDER — METOPROLOL TARTRATE 25 MG PO TABS: 25.0000 mg | ORAL_TABLET | Freq: Two times a day (BID) | ORAL | Status: DC

## 2013-04-07 NOTE — Progress Notes (Signed)
CARDIAC REHAB PHASE I   PRE:  Rate/Rhythm: 73 sinus  BP:  Sitting: 164/80 recheck on bedside 151/86   SaO2: 95 2L  MODE:  Ambulation: 150 ft   POST:  Rate/Rhythem: 87 sinus  BP:  Sitting: 196/92 recheck seated in chair 176/86   SaO2: 87-91 RA  Order received and appreciated.  Chart reviewed.  Pt ambulated 150 ft with assist x1.  Pt took one standing rest break for SOB.  Pt stated that he felt stronger and that it was further than he normally walks.  SaO2 was down to 87% RA upon return but quickly recovered to 91-92% with rest and breathing.  Pt was placed in upright chair for breakfast after walk with call bell in reach.  Reviewed pt d/c education: diet, exercise, restrictions, stent, when to call 911/MD.  Also discussed cardiac rehab Phase II with pt request to have info sent to [facility].  Pt voiced understanding. Fabio Pierce, MA, ACSM RCEP [phone]   Hazle Nordmann

## 2013-04-07 NOTE — Discharge Summary (Signed)
CARDIOLOGY DISCHARGE SUMMARY   Patient ID: Harold York MRN: [id number] DOB/AGE: [birth year] 77 y.o.  Admit date: 04/06/2013 Discharge date: 04/07/2013  Primary Discharge Diagnosis:   Accelerating angina Secondary Discharge Diagnosis:    Mixed hyperlipidemia   HTN (hypertension)  Procedures: Left Heart Cath, Selective Coronary Angiography, LV angiography, PTCA and stenting of the first OM  Hospital Course: Harold York is a 77 y.o. male with o history of CAD. He was seen by Dr. Diona Browner with symptoms that were concerning for unstable anginal pain/progressive angina. Cardiac catheterization was scheduled at the first opportunity and he came to the hospital for the procedure on 04/06/2013  Harold York had developed resting chest pain on the day of the heart catheterization. He was also significantly hypertensive in Short Stay. He was treated appropriately with nitroglycerin and his pain improved. Cardiac catheterization was performed, results are below. He had a stent to the OM1 and aggressive cardiac risk factor reduction is recommended.  Overnight, he remained on a nitroglycerin drip for blood pressure control. He was started on a beta blocker but does increase his limited to you to heart rate in the 50s at times. He was asymptomatic with this so is tolerating metoprolol at the current dose.  On 04/07/2013, Harold York was seen by Dr.Tess Potts and by cardiac rehabilitation. He was given instructions on our-healthy lifestyle changes, stent restrictions and exercise guidelines.   His cath site was without hematoma. He was ambulating without chest pain or shortness of breath and considered stable for discharge, to follow up as an outpatient in [place].  He will be discharged with Plavix and metoprolol added to his medication regimen and we will take him off his omeprazole, substituting Protonix. We will also add Pravachol 40 mg daily. A recent seem at performed at [facility] was  reviewed and showed normal liver functions except for a mildly elevated ALT.   General: Well developed, well nourished, male in no acute distress Head: Eyes PERRLA, No xanthomas.   Normocephalic and atraumatic  Lungs: Few rales bases, upper airway wheeze. Heart: HRRR S1 S2, without MRG.  Pulses are 2+ & equal. No carotid bruit. No JVD.   Abdomen: Bowel sounds are present, abdomen soft and non-tender without masses or  hernias noted. Msk: Normal strength and tone for age. Extremities: No clubbing, cyanosis or edema. Cath site without hematoma   Skin:  No rashes or lesions noted. Neuro: Alert and oriented X 3. Psych:  Good affect, responds appropriately  Labs:  Lab Results  Component Value Date   WBC 5.2 04/07/2013   HGB 11.6* 04/07/2013   HCT 35.0* 04/07/2013   MCV 87.3 04/07/2013   PLT 158 04/07/2013     Recent Labs Lab 04/07/13 0656  NA 136  K 4.1  CL 101  CO2 27  BUN 21  CREATININE 1.10  CALCIUM 8.8  GLUCOSE 109*    Recent Labs  04/06/13 1426  TROPONINI <0.30    Recent Labs  04/06/13 1015  INR 1.05     Cardiac Cath:  04/06/2013 Left mainstem: Widely patent, arises from the left cusp, short segment.  Left anterior descending (LAD): Large vessel, courses to the LV apex. No obstructive disease present. Diagonal 1 is large in caliber.  Left circumflex (LCx): Large intermediate branch without obstructive disease. AV circ is patent. The first OM has 80-90% eccentric stenosis over a focal portion of the proximal vessel.  Right coronary artery (RCA): Large dominant vessel. No obstructive disease. PDA and  PLA branches are patent.  Left ventriculography: Left ventricular systolic function is vigorous, LVEF is estimated at 65-70%, there is no significant mitral regurgitation  PCI Data:  Vessel - OM/Segment - proximal  Percent Stenosis (pre) 80  TIMI-flow 3  Stent 2.5x15 mm Xience DES  Percent Stenosis (post) 0  TIMI-flow (post) 3  Final Conclusions:  1. Severe  single vessel CAD of the OM branch of the circumlex  2. Widely patent LAD, left main, and RCA  3. Vigorous LV function  Recommendations:  ASA/plavix x 12 months, aggressive risk reduction.  EKG: Sinus rhythm, Vent. rate 73 BPM PR interval 168 ms QRS duration 82 ms QT/QTc 396/436 ms P-R-T axes 55 59 74   FOLLOW UP PLANS AND APPOINTMENTS Allergies  Allergen Reactions  . Lidocaine   . Penicillins      Medication List    STOP taking these medications       omeprazole 20 MG capsule  Commonly known as:  PRILOSEC      TAKE these medications       albuterol 108 (90 BASE) MCG/ACT inhaler  Commonly known as:  PROVENTIL HFA;VENTOLIN HFA  Inhale 2 puffs into the lungs every 6 (six) hours as needed for wheezing.     amLODipine 10 MG tablet  Commonly known as:  NORVASC  Take 10 mg by mouth daily.     ARTHROTEC 75-0.2 MG Tbec  Generic drug:  Diclofenac-Misoprostol  Take 1 tablet by mouth 2 (two) times daily.     aspirin 81 MG EC tablet  Take 1 tablet (81 mg total) by mouth daily.     clopidogrel 75 MG tablet  Commonly known as:  PLAVIX  Take 1 tablet (75 mg total) by mouth daily with breakfast.     fish oil-omega-3 fatty acids 1000 MG capsule  Take 2 g by mouth daily.     formoterol 12 MCG capsule for inhaler  Commonly known as:  FORADIL  Place 12 mcg into inhaler and inhale 2 (two) times daily.     gabapentin 300 MG capsule  Commonly known as:  NEURONTIN  Take 300 mg by mouth 3 (three) times daily.     lisinopril 20 MG tablet  Commonly known as:  PRINIVIL,ZESTRIL  Take 20 mg by mouth daily.     LORazepam 0.5 MG tablet  Commonly known as:  ATIVAN  Take 0.5 mg by mouth daily.     metoprolol tartrate 25 MG tablet  Commonly known as:  LOPRESSOR  Take 1 tablet (25 mg total) by mouth 2 (two) times daily.     nitroGLYCERIN 0.4 MG SL tablet  Commonly known as:  NITROSTAT  Place 1 tablet (0.4 mg total) under the tongue every 5 (five) minutes as needed for chest  pain.     pantoprazole 40 MG tablet  Commonly known as:  PROTONIX  Take 1 tablet (40 mg total) by mouth daily.     pravastatin 40 MG tablet  Commonly known as:  PRAVACHOL  Take 1 tablet (40 mg total) by mouth daily.     sertraline 50 MG tablet  Commonly known as:  ZOLOFT  Take 50 mg by mouth daily.     tiotropium 18 MCG inhalation capsule  Commonly known as:  SPIRIVA  Place 18 mcg into inhaler and inhale daily.        Discharge Orders   Future Orders Complete By Expires   Amb Referral to Cardiac Rehabilitation  As directed  Follow-up Information   Follow up with Nona Dell, MD. (The office will call)    Specialty:  Cardiology   Contact information:   [address] [place] [address] [phone]       BRING ALL MEDICATIONS WITH YOU TO FOLLOW UP APPOINTMENTS  Time spent with patient to include physician time: 42 min, speaking about shortness of breath, need for supplemental oxygen and BP control. Signed: Theodore Demark, PA-C 04/07/2013, 9:51 AM Co-Sign MD  I have examined the patient and reviewed assessment and plan and discussed with patient.  Agree with above as stated.  BP was much better controlled before leaving the hospital with systolic < 120.  Mild desat with walking, which quickly resolved. He has seen a pulmonologist and will f/u with him again.  He says he has used O2 in the past "with no difference."     Antonio Creswell S.

## 2013-04-12 ENCOUNTER — Telehealth: Payer: Self-pay | Admitting: *Deleted

## 2013-04-12 ENCOUNTER — Encounter (HOSPITAL_COMMUNITY): Payer: Self-pay | Admitting: Emergency Medicine

## 2013-04-12 ENCOUNTER — Emergency Department (HOSPITAL_COMMUNITY)
Admission: EM | Admit: 2013-04-12 | Discharge: 2013-04-12 | Disposition: A | Discharge status: Home / Self Care | Payer: Medicare Other | Attending: Emergency Medicine | Admitting: Emergency Medicine

## 2013-04-12 ENCOUNTER — Emergency Department (HOSPITAL_COMMUNITY): Payer: Medicare Other

## 2013-04-12 DIAGNOSIS — G4730 Sleep apnea, unspecified: Secondary | ICD-10-CM | POA: Insufficient documentation

## 2013-04-12 DIAGNOSIS — E782 Mixed hyperlipidemia: Secondary | ICD-10-CM | POA: Diagnosis not present

## 2013-04-12 DIAGNOSIS — Z9861 Coronary angioplasty status: Secondary | ICD-10-CM | POA: Diagnosis not present

## 2013-04-12 DIAGNOSIS — R079 Chest pain, unspecified: Secondary | ICD-10-CM | POA: Diagnosis not present

## 2013-04-12 DIAGNOSIS — Z7982 Long term (current) use of aspirin: Secondary | ICD-10-CM | POA: Diagnosis not present

## 2013-04-12 DIAGNOSIS — J449 Chronic obstructive pulmonary disease, unspecified: Secondary | ICD-10-CM | POA: Insufficient documentation

## 2013-04-12 DIAGNOSIS — Z85841 Personal history of malignant neoplasm of brain: Secondary | ICD-10-CM | POA: Diagnosis not present

## 2013-04-12 DIAGNOSIS — Z8551 Personal history of malignant neoplasm of bladder: Secondary | ICD-10-CM | POA: Insufficient documentation

## 2013-04-12 DIAGNOSIS — Z8546 Personal history of malignant neoplasm of prostate: Secondary | ICD-10-CM | POA: Diagnosis not present

## 2013-04-12 DIAGNOSIS — Z87442 Personal history of urinary calculi: Secondary | ICD-10-CM | POA: Diagnosis not present

## 2013-04-12 DIAGNOSIS — I10 Essential (primary) hypertension: Secondary | ICD-10-CM | POA: Insufficient documentation

## 2013-04-12 DIAGNOSIS — K219 Gastro-esophageal reflux disease without esophagitis: Secondary | ICD-10-CM | POA: Diagnosis not present

## 2013-04-12 DIAGNOSIS — J4489 Other specified chronic obstructive pulmonary disease: Secondary | ICD-10-CM | POA: Insufficient documentation

## 2013-04-12 DIAGNOSIS — J438 Other emphysema: Secondary | ICD-10-CM | POA: Diagnosis not present

## 2013-04-12 DIAGNOSIS — M1710 Unilateral primary osteoarthritis, unspecified knee: Secondary | ICD-10-CM | POA: Insufficient documentation

## 2013-04-12 DIAGNOSIS — Z87891 Personal history of nicotine dependence: Secondary | ICD-10-CM | POA: Insufficient documentation

## 2013-04-12 DIAGNOSIS — Z791 Long term (current) use of non-steroidal anti-inflammatories (NSAID): Secondary | ICD-10-CM | POA: Insufficient documentation

## 2013-04-12 DIAGNOSIS — Z79899 Other long term (current) drug therapy: Secondary | ICD-10-CM | POA: Insufficient documentation

## 2013-04-12 DIAGNOSIS — Z880 Allergy status to penicillin: Secondary | ICD-10-CM | POA: Diagnosis not present

## 2013-04-12 DIAGNOSIS — Z8701 Personal history of pneumonia (recurrent): Secondary | ICD-10-CM | POA: Insufficient documentation

## 2013-04-12 LAB — CBC WITH DIFFERENTIAL/PLATELET
Basophils Absolute: 0.1 10*3/uL (ref 0.0–0.1)
Basophils Relative: 1 % (ref 0–1)
Eosinophils Relative: 3 % (ref 0–5)
Lymphocytes Relative: 27 % (ref 12–46)
MCHC: 32.7 g/dL (ref 30.0–36.0)
MCV: 89.4 fL (ref 78.0–100.0)
Monocytes Absolute: 0.6 10*3/uL (ref 0.1–1.0)
Neutro Abs: 3.7 10*3/uL (ref 1.7–7.7)
Platelets: 205 10*3/uL (ref 150–400)
RDW: 15.5 % (ref 11.5–15.5)
WBC: 6.2 10*3/uL (ref 4.0–10.5)

## 2013-04-12 LAB — BASIC METABOLIC PANEL
BUN: 26 mg/dL — ABNORMAL HIGH (ref 6–23)
Calcium: 9.6 mg/dL (ref 8.4–10.5)
Creatinine, Ser: 1.37 mg/dL — ABNORMAL HIGH (ref 0.50–1.35)
GFR calc Af Amer: 57 mL/min — ABNORMAL LOW (ref >90)
GFR calc non Af Amer: 49 mL/min — ABNORMAL LOW (ref >90)
Glucose, Bld: 94 mg/dL (ref 70–99)

## 2013-04-12 LAB — TROPONIN I: Troponin I: <0.3 ng/mL (ref <0.30)

## 2013-04-12 MED ORDER — ACETAMINOPHEN 500 MG PO TABS
1000.0000 mg | ORAL_TABLET | Freq: Once | ORAL | Status: AC
  Administered 2013-04-12: 1000 mg via ORAL
  Filled 2013-04-12: qty 2

## 2013-04-12 NOTE — ED Notes (Signed)
Patient given discharge instruction, verbalized understand. Patient ambulatory out of the department.  

## 2013-04-12 NOTE — ED Notes (Signed)
MD at the bedside, pt complaining for headache, orders given.

## 2013-04-12 NOTE — ED Notes (Signed)
Onset of R sided chest tightness w/radiation down R arm today at 1300.  Had stent placed at [facility] on last Friday.  Associated SOB w/this pain, lightheadedness and nausea w/out vomiting.

## 2013-04-12 NOTE — ED Notes (Signed)
Pt currently denies CP

## 2013-04-12 NOTE — Telephone Encounter (Signed)
Patient said he didn't feel well this morning when he got up and said he feels worse now. Patient c/o nausea, chest pain rated #8 on a scale of 1-10 (10 being the greatest), right arm pain and headache. Patient said he has taken 2 nitroglycerins with no relief. Nurse advised patient that he needed to go to the ED for evaluation. Patient verbalized understanding of plan and says he will go to [facility] ED.

## 2013-04-12 NOTE — ED Provider Notes (Signed)
CSN: [id number]     Arrival date & time 04/12/13  1544 History   First MD Initiated Contact with Patient 04/12/13 1606     Chief Complaint  Patient presents with  . Chest Pain   (Consider location/radiation/quality/duration/timing/severity/associated sxs/prior Treatment) HPI .Marland KitchenMarland KitchenMarland Kitchen. central chest pain approximately 1 PM today with questionable radiation to right arm. No dyspnea, diaphoresis; minimal nausea.  Status post cardiac catheterization 04/06/2013 with stent.  Severity is mild. Symptoms have improved. Nothing makes symptoms better or worse   Past Medical History  Diagnosis Date  . Essential hypertension, benign   . Asthma   . COPD (chronic obstructive pulmonary disease)   . Cerebral meningioma   . Nephrolithiasis   . Mixed hyperlipidemia   . Pneumonia     "heck of alot of times" (04/06/2013)  . Shortness of breath     "all the time" (04/06/2013)  . Sleep apnea     "tried mask; couldn't do it" (04/06/2013)  . GERD (gastroesophageal reflux disease)   . Daily headache   . Arthritis     "knees; legs" (04/06/2013)  . Prostate cancer   . Bladder cancer   . Meningeal cancer     cerebral   Past Surgical History  Procedure Laterality Date  . Craniotomy for stereotactic guided surgery  07/2010    Meningioma resection  . Prostatectomy  2010  . Kidney stone surgery  2012    "opened me up" (04/06/2013)  . Cardiac catheterization  1990's  . Coronary angioplasty with stent placement  04/06/2013    "1" (04/06/2013)  . Cataract extraction w/ intraocular lens  implant, bilateral Bilateral 2013   Family History  Problem Relation Age of Onset  . Heart disease Brother    History  Substance Use Topics  . Smoking status: Former Smoker -- 4.00 packs/day for 35 years    Types: Cigarettes    Quit date: 06/29/1987  . Smokeless tobacco: Never Used  . Alcohol Use: No    Review of Systems  All other systems reviewed and are negative.    Allergies  Lidocaine and  Penicillins  Home Medications   Current Outpatient Rx  Name  Route  Sig  Dispense  Refill  . albuterol (PROVENTIL HFA;VENTOLIN HFA) 108 (90 BASE) MCG/ACT inhaler   Inhalation   Inhale 2 puffs into the lungs every 6 (six) hours as needed for wheezing.   1 Inhaler   2   . amLODipine (NORVASC) 10 MG tablet   Oral   Take 10 mg by mouth daily.         Marland Kitchen aspirin EC 81 MG EC tablet   Oral   Take 1 tablet (81 mg total) by mouth daily.   30 tablet   0   . clopidogrel (PLAVIX) 75 MG tablet   Oral   Take 1 tablet (75 mg total) by mouth daily with breakfast.   30 tablet   11   . Diclofenac-Misoprostol (ARTHROTEC) 75-0.2 MG TBEC   Oral   Take 1 tablet by mouth 2 (two) times daily.         . fish oil-omega-3 fatty acids 1000 MG capsule   Oral   Take 2 g by mouth daily.         . formoterol (FORADIL) 12 MCG capsule for inhaler   Inhalation   Place 12 mcg into inhaler and inhale 2 (two) times daily.         Marland Kitchen gabapentin (NEURONTIN) 300 MG capsule   Oral  Take 300 mg by mouth 3 (three) times daily.         Marland Kitchen lisinopril (PRINIVIL,ZESTRIL) 20 MG tablet   Oral   Take 20 mg by mouth daily.         Marland Kitchen LORazepam (ATIVAN) 0.5 MG tablet   Oral   Take 0.5 mg by mouth daily.          . metoprolol tartrate (LOPRESSOR) 25 MG tablet   Oral   Take 1 tablet (25 mg total) by mouth 2 (two) times daily.   60 tablet   11   . pantoprazole (PROTONIX) 40 MG tablet   Oral   Take 1 tablet (40 mg total) by mouth daily.   30 tablet   11   . pravastatin (PRAVACHOL) 40 MG tablet   Oral   Take 1 tablet (40 mg total) by mouth daily.   30 tablet   11   . tiotropium (SPIRIVA) 18 MCG inhalation capsule   Inhalation   Place 18 mcg into inhaler and inhale daily.         . nitroGLYCERIN (NITROSTAT) 0.4 MG SL tablet   Sublingual   Place 1 tablet (0.4 mg total) under the tongue every 5 (five) minutes as needed for chest pain.   25 tablet   3    BP 147/68  Pulse 51   Temp(Src) 97.7 F (36.5 C) (Oral)  Ht 5\' 10"  (1.778 m)  Wt 263 lb (119.296 kg)  BMI 37.74 kg/m2  SpO2 94% Physical Exam  Nursing note and vitals reviewed. Constitutional: He is oriented to person, place, and time. He appears well-developed and well-nourished.  HENT:  Head: Normocephalic and atraumatic.  Eyes: Conjunctivae and EOM are normal. Pupils are equal, round, and reactive to light.  Neck: Normal range of motion. Neck supple.  Cardiovascular: Normal rate, regular rhythm and normal heart sounds.   Pulmonary/Chest: Effort normal and breath sounds normal.  Abdominal: Soft. Bowel sounds are normal.  Musculoskeletal: Normal range of motion.  Neurological: He is alert and oriented to person, place, and time.  Skin: Skin is warm and dry.  Psychiatric: He has a normal mood and affect.    ED Course  Procedures (including critical care time) Labs Review Labs Reviewed  BASIC METABOLIC PANEL - Abnormal; Notable for the following:    BUN 26 (*)    Creatinine, Ser 1.37 (*)    GFR calc non Af Amer 49 (*)    GFR calc Af Amer 57 (*)    All other components within normal limits  CBC WITH DIFFERENTIAL  TROPONIN I   Imaging Review No results found.  EKG Interpretation     Ventricular Rate:  62 PR Interval:  178 QRS Duration: 78 QT Interval:  418 QTC Calculation: 424 R Axis:   73 Text Interpretation:  Normal sinus rhythm Normal ECG When compared with ECG of 07-Apr-2013 04:10, No significant change was found            Date: 04/12/2013  Rate: 62  Rhythm: normal sinus rhythm  QRS Axis: normal  Intervals: normal  ST/T Wave abnormalities: normal  Conduction Disutrbances: none  Narrative Interpretation: unremarkable    MDM  No diagnosis found.   Patient looks well. Good color. EKG normal sinus rhythm. Troponin negative.  Patient has primary care and cardiology followup    Donnetta Hutching, MD 04/12/13 2023

## 2013-04-30 ENCOUNTER — Encounter: Payer: Self-pay | Admitting: Cardiology

## 2013-04-30 ENCOUNTER — Ambulatory Visit (INDEPENDENT_AMBULATORY_CARE_PROVIDER_SITE_OTHER): Payer: Medicare Other | Admitting: Cardiology

## 2013-04-30 VITALS — BP 181/96 | HR 59 | Ht 70.0 in | Wt 265.8 lb

## 2013-04-30 DIAGNOSIS — I2510 Atherosclerotic heart disease of native coronary artery without angina pectoris: Secondary | ICD-10-CM | POA: Diagnosis not present

## 2013-04-30 DIAGNOSIS — I10 Essential (primary) hypertension: Secondary | ICD-10-CM

## 2013-04-30 MED ORDER — ISOSORBIDE MONONITRATE ER 30 MG PO TB24: 30.0000 mg | ORAL_TABLET | Freq: Every day | ORAL | Status: DC

## 2013-04-30 NOTE — Assessment & Plan Note (Signed)
Continue medical therapy and observation. Imdur 30 mg daily to be added as well. Followup arranged.

## 2013-04-30 NOTE — Assessment & Plan Note (Signed)
Continue current treatment. We discussed diet and sodium restriction. Also adding Imdur for antianginal benefit.

## 2013-04-30 NOTE — Patient Instructions (Signed)
Your physician recommends that you schedule a follow-up appointment in: 3 months. Your physician has recommended you make the following change in your medication: Start imdur (isosorbide mononitrate) 30 mg daily. Your new prescription has been sent to your pharmacy. All other medications will remain the same.

## 2013-04-30 NOTE — Progress Notes (Signed)
Clinical Summary Harold York is a 77 y.o.male presenting for hospital followup. He underwent cardiac catheterization earlier in the month with Dr. Excell Seltzer demonstrating significant obtuse marginal disease that was managed with DES. Major epicardials otherwise showed no significant stenoses. He did have a subsequent ER visit after discharge, cardiac enzymes normal and ECG without significant change.  Recent lab work showed potassium 4.5, BUN 26, creatinine 1.3, hemoglobin 13.0, platelets 205.  Patient presents today, states that he feels better than he has felt in the last 3 years. Has more stamina. Has noted that blood pressure is elevated at times. We reviewed his medications. He reports compliance. Has taken nitroglycerin on 2 occasions.   Allergies  Allergen Reactions  . Lidocaine   . Penicillins     Current Outpatient Prescriptions  Medication Sig Dispense Refill  . albuterol (PROVENTIL HFA;VENTOLIN HFA) 108 (90 BASE) MCG/ACT inhaler Inhale 2 puffs into the lungs every 6 (six) hours as needed for wheezing.  1 Inhaler  2  . amLODipine (NORVASC) 10 MG tablet Take 10 mg by mouth daily.      Marland Kitchen aspirin EC 81 MG EC tablet Take 1 tablet (81 mg total) by mouth daily.  30 tablet  0  . clopidogrel (PLAVIX) 75 MG tablet Take 1 tablet (75 mg total) by mouth daily with breakfast.  30 tablet  11  . Diclofenac-Misoprostol (ARTHROTEC) 75-0.2 MG TBEC Take 1 tablet by mouth 2 (two) times daily.      . fish oil-omega-3 fatty acids 1000 MG capsule Take 1 g by mouth daily.       . formoterol (FORADIL) 12 MCG capsule for inhaler Place 12 mcg into inhaler and inhale 2 (two) times daily.      Marland Kitchen gabapentin (NEURONTIN) 300 MG capsule Take 300 mg by mouth 3 (three) times daily.      Marland Kitchen lisinopril (PRINIVIL,ZESTRIL) 20 MG tablet Take 20 mg by mouth daily.      Marland Kitchen LORazepam (ATIVAN) 0.5 MG tablet Take 0.5 mg by mouth daily.       . metoprolol tartrate (LOPRESSOR) 25 MG tablet Take 1 tablet (25 mg total) by mouth  2 (two) times daily.  60 tablet  11  . nitroGLYCERIN (NITROSTAT) 0.4 MG SL tablet Place 1 tablet (0.4 mg total) under the tongue every 5 (five) minutes as needed for chest pain.  25 tablet  3  . pantoprazole (PROTONIX) 40 MG tablet Take 1 tablet (40 mg total) by mouth daily.  30 tablet  11  . pravastatin (PRAVACHOL) 40 MG tablet Take 1 tablet (40 mg total) by mouth daily.  30 tablet  11  . tiotropium (SPIRIVA) 18 MCG inhalation capsule Place 18 mcg into inhaler and inhale daily.      . isosorbide mononitrate (IMDUR) 30 MG 24 hr tablet Take 1 tablet (30 mg total) by mouth daily.  90 tablet  3   No current facility-administered medications for this visit.    Past Medical History  Diagnosis Date  . Essential hypertension, benign   . Asthma   . COPD (chronic obstructive pulmonary disease)   . Cerebral meningioma   . Nephrolithiasis   . Mixed hyperlipidemia   . History of pneumonia   . Sleep apnea     Unable to tolerate CPAP  . GERD (gastroesophageal reflux disease)   . Arthritis   . Prostate cancer   . Bladder cancer   . Coronary atherosclerosis of native coronary artery     DES to OM  October 2014    Social History Mr. Harig reports that he quit smoking about 25 years ago. His smoking use included Cigarettes. He has a 140 pack-year smoking history. He has never used smokeless tobacco. Mr. Wolgamott reports that he does not drink alcohol.  Review of Systems No palpitations, dizziness, syncope. No reported bleeding problems.  Physical Examination Filed Vitals:   04/30/13 0911  BP: 181/96  Pulse: 59   Filed Weights   04/30/13 0911  Weight: 265 lb 12.8 oz (120.566 kg)    Obese male, no acute distress.  HEENT: Conjunctiva and lids normal, oropharynx clear.  Neck: Supple, increased girth, no carotid bruits, no thyromegaly.  Lungs: Diminished but clear to auscultation, nonlabored breathing at rest.  Cardiac: Regular rate and rhythm, no S3 or significant systolic murmur, no  pericardial rub.  Abdomen: Soft, nontender, protuberant, bowel sounds present, no guarding or rebound.  Extremities: 1+ edema, distal pulses 2+.  Skin: Warm and dry.  Musculoskeletal: No kyphosis.  Neuropsychiatric: Alert and oriented x3, affect grossly appropriate.   Problem List and Plan   Coronary atherosclerosis of native coronary artery Continue medical therapy and observation. Imdur 30 mg daily to be added as well. Followup arranged.  Essential hypertension, benign Continue current treatment. We discussed diet and sodium restriction. Also adding Imdur for antianginal benefit.    Jonelle Sidle, M.D., F.A.C.C.

## 2013-06-18 DIAGNOSIS — I10 Essential (primary) hypertension: Secondary | ICD-10-CM | POA: Diagnosis not present

## 2013-06-18 DIAGNOSIS — E782 Mixed hyperlipidemia: Secondary | ICD-10-CM | POA: Diagnosis not present

## 2013-08-14 ENCOUNTER — Ambulatory Visit: Payer: Medicare Other | Admitting: Cardiology

## 2013-09-06 ENCOUNTER — Ambulatory Visit (INDEPENDENT_AMBULATORY_CARE_PROVIDER_SITE_OTHER): Payer: Medicare Other | Admitting: Cardiology

## 2013-09-06 ENCOUNTER — Encounter: Payer: Self-pay | Admitting: Cardiology

## 2013-09-06 VITALS — BP 160/93 | HR 73 | Ht 70.0 in | Wt 256.0 lb

## 2013-09-06 DIAGNOSIS — E782 Mixed hyperlipidemia: Secondary | ICD-10-CM | POA: Diagnosis not present

## 2013-09-06 DIAGNOSIS — I2510 Atherosclerotic heart disease of native coronary artery without angina pectoris: Secondary | ICD-10-CM | POA: Diagnosis not present

## 2013-09-06 DIAGNOSIS — I10 Essential (primary) hypertension: Secondary | ICD-10-CM

## 2013-09-06 MED ORDER — LISINOPRIL 40 MG PO TABS: 40.0000 mg | ORAL_TABLET | Freq: Every day | ORAL | Status: DC

## 2013-09-06 NOTE — Assessment & Plan Note (Signed)
Continue current medical regimen. We will increase his lisinopril to 40 mg daily to try and get a better handle on blood pressure control as this may also help reduce his angina symptoms. May otherwise increase his Imdur to twice daily dosing. Followup arranged.

## 2013-09-06 NOTE — Assessment & Plan Note (Signed)
He continues on Pravachol, followed by Dr. Sherrie Sport.

## 2013-09-06 NOTE — Progress Notes (Signed)
Clinical Summary Mr. Delair is a 78 y.o.male last seen in October 2014. He reports compliance with his medications, intermittent angina symptoms, but still has been doing much better than prior to his coronary intervention. 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  He does indicate his blood pressure has been elevated when he checks it at home.  He continues to follow with Dr. Sherrie Sport for routine lab work.   Allergies  Allergen Reactions  . Lidocaine   . Penicillins     Current Outpatient Prescriptions  Medication Sig Dispense Refill  . albuterol (PROVENTIL HFA;VENTOLIN HFA) 108 (90 BASE) MCG/ACT inhaler Inhale 2 puffs into the lungs every 6 (six) hours as needed for wheezing.  1 Inhaler  2  . amLODipine (NORVASC) 10 MG tablet Take 10 mg by mouth daily.      Marland Kitchen aspirin EC 81 MG EC tablet Take 1 tablet (81 mg total) by mouth daily.  30 tablet  0  . clopidogrel (PLAVIX) 75 MG tablet Take 1 tablet (75 mg total) by mouth daily with breakfast.  30 tablet  11  . Diclofenac-Misoprostol (ARTHROTEC) 75-0.2 MG TBEC Take 1 tablet by mouth 2 (two) times daily.      . fish oil-omega-3 fatty acids 1000 MG capsule Take 1 g by mouth daily.       . formoterol (FORADIL) 12 MCG capsule for inhaler Place 12 mcg into inhaler and inhale 2 (two) times daily.      Marland Kitchen gabapentin (NEURONTIN) 300 MG capsule Take 300 mg by mouth 3 (three) times daily.      . isosorbide mononitrate (IMDUR) 30 MG 24 hr tablet Take 1 tablet (30 mg total) by mouth daily.  90 tablet  3  . LORazepam (ATIVAN) 0.5 MG tablet Take 0.5 mg by mouth daily.       . metoprolol tartrate (LOPRESSOR) 25 MG tablet Take 1 tablet (25 mg total) by mouth 2 (two) times daily.  60 tablet  11  . nitroGLYCERIN (NITROSTAT) 0.4 MG SL tablet Place 1 tablet (0.4 mg total) under the tongue every 5 (five) minutes as needed for chest pain.  25 tablet  3  . pantoprazole (PROTONIX) 40 MG tablet Take 1 tablet (40 mg total) by mouth daily.  30 tablet  11  . pravastatin  (PRAVACHOL) 40 MG tablet Take 1 tablet (40 mg total) by mouth daily.  30 tablet  11  . tiotropium (SPIRIVA) 18 MCG inhalation capsule Place 18 mcg into inhaler and inhale daily.      Marland Kitchen lisinopril (PRINIVIL,ZESTRIL) 40 MG tablet Take 1 tablet (40 mg total) by mouth daily.  90 tablet  3   No current facility-administered medications for this visit.    Past Medical History  Diagnosis Date  . Essential hypertension, benign   . Asthma   . COPD (chronic obstructive pulmonary disease)   . Cerebral meningioma   . Nephrolithiasis   . Mixed hyperlipidemia   . History of pneumonia   . Sleep apnea     Unable to tolerate CPAP  . GERD (gastroesophageal reflux disease)   . Arthritis   . Prostate cancer   . Bladder cancer   . Coronary atherosclerosis of native coronary artery     DES to OM October 2014    Social History Mr. Schnitzer reports that he quit smoking about 26 years ago. His smoking use included Cigarettes. He has a 140 pack-year smoking history. He has never used smokeless tobacco. Mr. Monier reports that he does not  drink alcohol.  Review of Systems No palpitations, syncope. Good appetite. No reported bleeding episodes. Otherwise negative.  Physical Examination Filed Vitals:   09/06/13 1418  BP: 160/93  Pulse: 73   Filed Weights   09/06/13 1418  Weight: 256 lb (116.121 kg)    Obese male, no acute distress.  HEENT: Conjunctiva and lids normal, oropharynx clear.  Neck: Supple, increased girth, no carotid bruits, no thyromegaly.  Lungs: Diminished but clear to auscultation, nonlabored breathing at rest.  Cardiac: Regular rate and rhythm, no S3 or significant systolic murmur, no pericardial rub.  Abdomen: Soft, nontender, protuberant, bowel sounds present, no guarding or rebound.  Extremities: 1+ edema, distal pulses 2+.  Skin: Warm and dry.  Musculoskeletal: No kyphosis.  Neuropsychiatric: Alert and oriented x3, affect grossly appropriate.   Problem List and Plan    Coronary atherosclerosis of native coronary artery Continue current medical regimen. We will increase his lisinopril to 40 mg daily to try and get a better handle on blood pressure control as this may also help reduce his angina symptoms. May otherwise increase his Imdur to twice daily dosing. Followup arranged.  Essential hypertension, benign Not optimally controlled. Increase lisinopril to 40 mg daily. Followup BMET in 2 weeks.  Mixed hyperlipidemia He continues on Pravachol, followed by Dr. Sherrie Sport.    Satira Sark, M.D., F.A.C.C.

## 2013-09-06 NOTE — Patient Instructions (Addendum)
Your physician recommends that you schedule a follow-up appointment in: 4 months. You will receive a reminder letter in the mail in about 1-2 months reminding you to call and schedule your appointment. If you don't receive this letter, please contact our office. Your physician has recommended you make the following change in your medication: Increase your lisinopril to 40 mg daily. You may take (2) of your 20 mg tablets daily until they are finished. Your new prescription has been sent to your pharmacy. All other medications will remain the same. Your physician recommends that you have lab work in 2 weeks to check your BMET.

## 2013-09-06 NOTE — Assessment & Plan Note (Signed)
Not optimally controlled. Increase lisinopril to 40 mg daily. Followup BMET in 2 weeks.

## 2013-09-18 DIAGNOSIS — I10 Essential (primary) hypertension: Secondary | ICD-10-CM | POA: Diagnosis not present

## 2013-09-27 ENCOUNTER — Telehealth: Payer: Self-pay | Admitting: *Deleted

## 2013-09-27 NOTE — Telephone Encounter (Signed)
Reviewed. Would call Dr. Zada Girt for confirmation on her medications, and need to restart statin.

## 2013-09-27 NOTE — Telephone Encounter (Signed)
Patient informed and say's the only thing he takes for cholesterol is fish oil. Patient informed nurse that he doesn't remember ever taking a cholesterol pill. Patient's medications were reviewed over the phone.

## 2013-09-27 NOTE — Telephone Encounter (Signed)
Message copied by Merlene Laughter on Thu Sep 27, 2013 11:49 AM ------      Message from: Satira Sark      Created: Fri Sep 21, 2013  8:53 AM       Reviewed recent lab work done by Dr. Sherrie Sport. LDL was 104 on Pravachol 40 mg daily. If he has been consistent with this dose, may want to consider increasing to Pravachol 80 mg daily for more aggressive control. ------

## 2013-10-02 MED ORDER — PRAVASTATIN SODIUM 40 MG PO TABS: 40.0000 mg | ORAL_TABLET | Freq: Every evening | ORAL | Status: DC

## 2013-10-02 NOTE — Telephone Encounter (Signed)
Called to confirm with patient and pharmacy that he's not on any cholesterol medication. Per patient, he isn't on any cholesterol medication. Per pharmacy, patient last received pravachol on 04/07/13 #30. Patient advised that new prescription will be sent.

## 2013-10-23 DIAGNOSIS — I10 Essential (primary) hypertension: Secondary | ICD-10-CM | POA: Diagnosis not present

## 2013-11-10 ENCOUNTER — Encounter: Payer: Self-pay | Admitting: Cardiology

## 2013-11-10 DIAGNOSIS — E785 Hyperlipidemia, unspecified: Secondary | ICD-10-CM | POA: Diagnosis not present

## 2013-11-10 DIAGNOSIS — E669 Obesity, unspecified: Secondary | ICD-10-CM | POA: Diagnosis not present

## 2013-11-10 DIAGNOSIS — R079 Chest pain, unspecified: Secondary | ICD-10-CM | POA: Diagnosis not present

## 2013-11-10 DIAGNOSIS — Z87891 Personal history of nicotine dependence: Secondary | ICD-10-CM | POA: Diagnosis not present

## 2013-11-10 DIAGNOSIS — J449 Chronic obstructive pulmonary disease, unspecified: Secondary | ICD-10-CM | POA: Diagnosis not present

## 2013-11-10 DIAGNOSIS — R7309 Other abnormal glucose: Secondary | ICD-10-CM | POA: Diagnosis not present

## 2013-11-10 DIAGNOSIS — Z923 Personal history of irradiation: Secondary | ICD-10-CM | POA: Diagnosis not present

## 2013-11-10 DIAGNOSIS — Z79899 Other long term (current) drug therapy: Secondary | ICD-10-CM | POA: Diagnosis not present

## 2013-11-10 DIAGNOSIS — I252 Old myocardial infarction: Secondary | ICD-10-CM | POA: Diagnosis not present

## 2013-11-10 DIAGNOSIS — R0789 Other chest pain: Secondary | ICD-10-CM | POA: Diagnosis not present

## 2013-11-10 DIAGNOSIS — K219 Gastro-esophageal reflux disease without esophagitis: Secondary | ICD-10-CM | POA: Diagnosis not present

## 2013-11-10 DIAGNOSIS — I10 Essential (primary) hypertension: Secondary | ICD-10-CM | POA: Diagnosis not present

## 2013-11-10 DIAGNOSIS — Z8546 Personal history of malignant neoplasm of prostate: Secondary | ICD-10-CM | POA: Diagnosis not present

## 2013-11-10 DIAGNOSIS — Z9861 Coronary angioplasty status: Secondary | ICD-10-CM | POA: Diagnosis not present

## 2013-11-10 DIAGNOSIS — Z86011 Personal history of benign neoplasm of the brain: Secondary | ICD-10-CM | POA: Diagnosis not present

## 2013-11-10 DIAGNOSIS — G4730 Sleep apnea, unspecified: Secondary | ICD-10-CM | POA: Diagnosis not present

## 2013-11-10 DIAGNOSIS — R918 Other nonspecific abnormal finding of lung field: Secondary | ICD-10-CM | POA: Diagnosis not present

## 2013-11-10 DIAGNOSIS — M129 Arthropathy, unspecified: Secondary | ICD-10-CM | POA: Diagnosis not present

## 2013-11-11 ENCOUNTER — Encounter: Payer: Self-pay | Admitting: Cardiology

## 2013-11-11 DIAGNOSIS — I10 Essential (primary) hypertension: Secondary | ICD-10-CM | POA: Diagnosis not present

## 2013-11-11 DIAGNOSIS — J449 Chronic obstructive pulmonary disease, unspecified: Secondary | ICD-10-CM | POA: Diagnosis not present

## 2013-11-11 DIAGNOSIS — K219 Gastro-esophageal reflux disease without esophagitis: Secondary | ICD-10-CM | POA: Diagnosis not present

## 2013-11-11 DIAGNOSIS — R0789 Other chest pain: Secondary | ICD-10-CM | POA: Diagnosis not present

## 2013-11-12 DIAGNOSIS — R079 Chest pain, unspecified: Secondary | ICD-10-CM | POA: Diagnosis not present

## 2013-11-27 ENCOUNTER — Ambulatory Visit (INDEPENDENT_AMBULATORY_CARE_PROVIDER_SITE_OTHER): Payer: Medicare Other | Admitting: Cardiology

## 2013-11-27 ENCOUNTER — Encounter: Payer: Self-pay | Admitting: Cardiology

## 2013-11-27 VITALS — BP 160/94 | HR 60 | Ht 70.0 in | Wt 257.8 lb

## 2013-11-27 DIAGNOSIS — I2510 Atherosclerotic heart disease of native coronary artery without angina pectoris: Secondary | ICD-10-CM | POA: Diagnosis not present

## 2013-11-27 DIAGNOSIS — I10 Essential (primary) hypertension: Secondary | ICD-10-CM

## 2013-11-27 DIAGNOSIS — E782 Mixed hyperlipidemia: Secondary | ICD-10-CM

## 2013-11-27 MED ORDER — ISOSORBIDE MONONITRATE ER 30 MG PO TB24: 30.0000 mg | ORAL_TABLET | Freq: Two times a day (BID) | ORAL | Status: AC

## 2013-11-27 MED ORDER — AMLODIPINE BESYLATE 5 MG PO TABS
5.0000 mg | ORAL_TABLET | Freq: Every day | ORAL | Status: AC
Start: 2013-11-27 — End: ?

## 2013-11-27 NOTE — Patient Instructions (Signed)
Your physician recommends that you schedule a follow-up appointment in: 3 months. Your physician has recommended you make the following change in your medication:  Increase your isosorbide mononitrate 30 mg to twice daily. Start amlodipine 5 mg daily. Your new prescriptions have been sent to your pharmacy. Continue all other medications the same.

## 2013-11-27 NOTE — Assessment & Plan Note (Signed)
He continues on Pravachol, followed by Dr. Sherrie Sport.

## 2013-11-27 NOTE — Assessment & Plan Note (Signed)
Adding Norvasc 5 mg daily, will also serve as an antianginal.

## 2013-11-27 NOTE — Assessment & Plan Note (Signed)
Continue medical therapy, increase Imdur to 30 mg twice daily. Followup arranged.

## 2013-11-27 NOTE — Progress Notes (Signed)
Clinical Summary [name] is a 78 y.o.male last seen in the office in March. Record review finds recent hospitalization at [facility] in May due to chest pain. He ruled out for myocardial infarction, was evaluated by Dr. Eula York.  Recent lab work showed BUN is 19, creatinine 1.0, normal troponin I, hemoglobin 13.8, platelets 173. Chest x-ray reported no active cardiac pulmonary process.  Lexiscan Cardiolite done at [facility] in September 2014 demonstrated an area of potential ischemia in the anteroapical/anteroseptal distribution, LVEF 61%. Cardiac catheterization done in October 2014 demonstrated single vessel CAD involving the obtuse marginal branch with widely patent LAD, and RCA. He underwent placement of DES to the obtuse marginal at that time.  He tells me that his blood pressure has been significantly elevated with diastolics over 970 and systolics and up into the 180s. He reports compliance with his medications. He has also been having probable angina symptoms. Complicating matters is sleep apnea with intolerance to CPAP, daytime somnolence.   Allergies  Allergen Reactions  . Lidocaine   . Penicillins     Current Outpatient Prescriptions  Medication Sig Dispense Refill  . albuterol (PROVENTIL HFA;VENTOLIN HFA) 108 (90 BASE) MCG/ACT inhaler Inhale 2 puffs into the lungs every 6 (six) hours as needed for wheezing.  1 Inhaler  2  . aspirin EC 81 MG EC tablet Take 1 tablet (81 mg total) by mouth daily.  30 tablet  0  . Diclofenac-Misoprostol (ARTHROTEC) 75-0.2 MG TBEC Take 1 tablet by mouth 2 (two) times daily.      . fish oil-omega-3 fatty acids 1000 MG capsule Take 1 g by mouth daily.       . formoterol (FORADIL) 12 MCG capsule for inhaler Place 12 mcg into inhaler and inhale 2 (two) times daily.      Marland Kitchen gabapentin (NEURONTIN) 300 MG capsule Take 300 mg by mouth 3 (three) times daily.      . isosorbide mononitrate (IMDUR) 30 MG 24 hr tablet Take 1 tablet (30 mg total) by mouth 2  (two) times daily.  180 tablet  3  . LORazepam (ATIVAN) 0.5 MG tablet Take 0.5 mg by mouth daily.       . metoprolol (LOPRESSOR) 50 MG tablet Take 50 mg by mouth 2 (two) times daily.      . nitroGLYCERIN (NITROSTAT) 0.4 MG SL tablet Place 1 tablet (0.4 mg total) under the tongue every 5 (five) minutes as needed for chest pain.  25 tablet  3  . omeprazole (PRILOSEC) 40 MG capsule Take 40 mg by mouth daily.      . pravastatin (PRAVACHOL) 40 MG tablet Take 1 tablet (40 mg total) by mouth every evening.  90 tablet  3  . tiotropium (SPIRIVA) 18 MCG inhalation capsule Place 18 mcg into inhaler and inhale daily.      Marland Kitchen amLODipine (NORVASC) 5 MG tablet Take 1 tablet (5 mg total) by mouth daily.  90 tablet  3   No current facility-administered medications for this visit.    Past Medical History  Diagnosis Date  . Essential hypertension, benign   . Asthma   . COPD (chronic obstructive pulmonary disease)   . Cerebral meningioma   . Nephrolithiasis   . Mixed hyperlipidemia   . History of pneumonia   . Sleep apnea     Unable to tolerate CPAP  . GERD (gastroesophageal reflux disease)   . Arthritis   . Prostate cancer   . Bladder cancer   . Coronary atherosclerosis of  native coronary artery     DES to OM October 2014    Social History [name] reports that he quit smoking about 26 years ago. His smoking use included Cigarettes. He has a 140 pack-year smoking history. He has never used smokeless tobacco. [name] reports that he does not drink alcohol.  Review of Systems No palpitations or syncope. No orthopnea or PND. Otherwise as outlined.  Physical Examination Filed Vitals:   11/27/13 1627  BP: 160/94  Pulse: 60   Filed Weights   11/27/13 1627  Weight: 257 lb 12.8 oz (116.937 kg)    Obese male, no acute distress.  HEENT: Conjunctiva and lids normal, oropharynx clear.  Neck: Supple, increased girth, no carotid bruits, no thyromegaly.  Lungs: Diminished but clear to  auscultation, nonlabored breathing at rest.  Cardiac: Regular rate and rhythm, no S3 or significant systolic murmur, no pericardial rub.  Abdomen: Soft, nontender, protuberant, bowel sounds present, no guarding or rebound.  Extremities: 1+ edema, distal pulses 2+.  Skin: Warm and dry.  Musculoskeletal: No kyphosis.  Neuropsychiatric: Alert and oriented x3, affect grossly appropriate.    Problem List and Plan   Coronary atherosclerosis of native coronary artery Continue medical therapy, increase Imdur to 30 mg twice daily. Followup arranged.  Essential hypertension, benign Adding Norvasc 5 mg daily, will also serve as an antianginal.  Mixed hyperlipidemia He continues on Pravachol, followed by Dr. Sherrie York.    Harold York, M.D., F.A.C.C.

## 2013-12-15 IMAGING — CT CT ANGIO CHEST
1 of 6 series · 5 of 36 positions shown · IV contrast (Omnipaque 300)
Comparison: 01/15/2010

CLINICAL DATA: Syncope was shortness of breath.

EXAM:
CT ANGIOGRAPHY CHEST WITH CONTRAST
TECHNIQUE: Multidetector CT imaging of the chest was performed using the
standard protocol during bolus administration of intravenous
contrast. Multiplanar CT image reconstructions including MIPs were
obtained to evaluate the vascular anatomy.
CONTRAST:  120mL OMNIPAQUE IOHEXOL 350 MG/ML SOLN

[Series 4: pe 3.0 b40f · axial · 0.78mm/px · z∈[-248,-95]mm · 5 of 77 slices shown]
[im 13/77  lung]
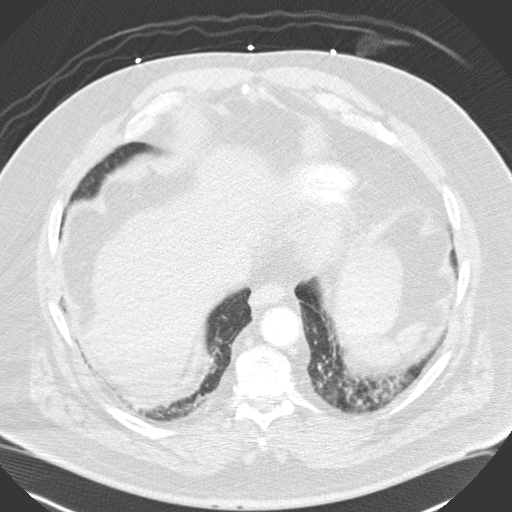
[im 26/77  mediastinal]
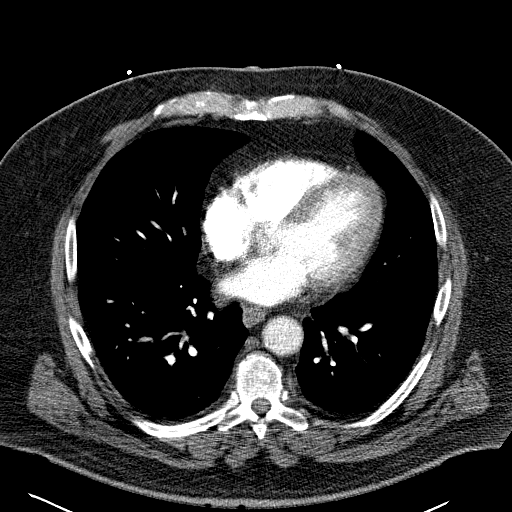
[im 39/77  lung]
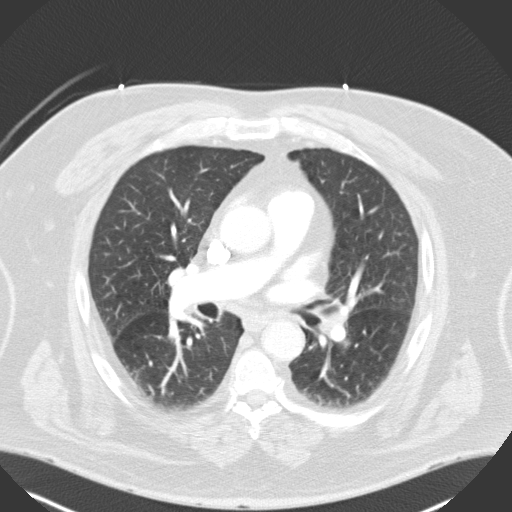
[im 51/77  mediastinal]
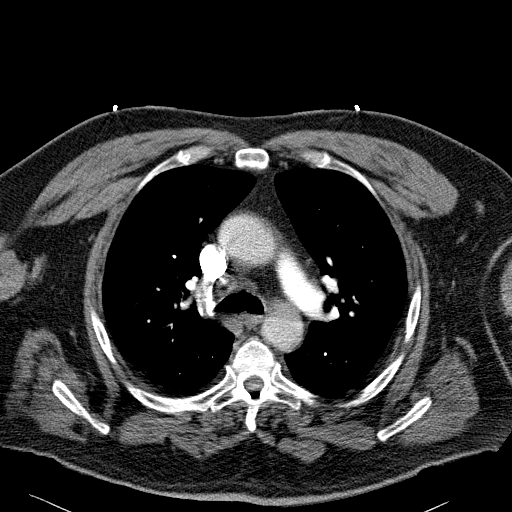
[im 64/77  lung]
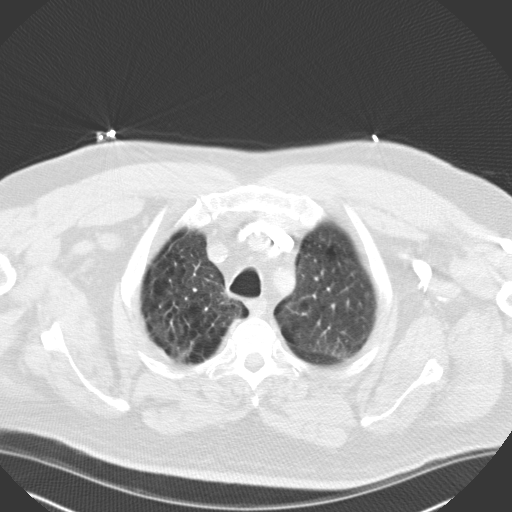

[5 of 36 positions shown; findings below may reference images not displayed]

FINDINGS: THORACIC INLET/BODY WALL:

No acute abnormality.

MEDIASTINUM:

Normal heart size. No pericardial effusion. No acute vascular
abnormality. No adenopathy.

LUNG WINDOWS:

No consolidation. No effusion. There are multiple pulmonary nodules
which are unchanged from prior. A lateral left costophrenic sulcus
nodule with central coarse calcification is unchanged at 1 cm.
Spiculated nodule in the upper right lobe continues to measure 9 mm.
Additional smaller bilateral pulmonary nodules, some calcified, also
remain not significantly changed. Biapical centrilobular emphysema
and diffuse bronchial wall thickening.

UPPER ABDOMEN:

No acute findings.

OSSEOUS:

Severe bilateral glenohumeral osteoarthritis.

Review of the MIP images confirms the above findings.
IMPRESSION: 1. Negative for pulmonary embolism.
2. Emphysema.

## 2014-02-27 ENCOUNTER — Encounter: Payer: Self-pay | Admitting: Cardiology

## 2014-02-27 ENCOUNTER — Ambulatory Visit (INDEPENDENT_AMBULATORY_CARE_PROVIDER_SITE_OTHER): Payer: Medicare Other | Admitting: Cardiology

## 2014-02-27 VITALS — BP 148/78 | HR 62 | Ht 70.0 in | Wt 264.0 lb

## 2014-02-27 DIAGNOSIS — I2510 Atherosclerotic heart disease of native coronary artery without angina pectoris: Secondary | ICD-10-CM

## 2014-02-27 DIAGNOSIS — I10 Essential (primary) hypertension: Secondary | ICD-10-CM | POA: Diagnosis not present

## 2014-02-27 MED ORDER — METOPROLOL TARTRATE 50 MG PO TABS: 50.0000 mg | ORAL_TABLET | Freq: Two times a day (BID) | ORAL | Status: AC

## 2014-02-27 NOTE — Assessment & Plan Note (Signed)
Continue current medications and keep followup with Dr. Sherrie Sport.

## 2014-02-27 NOTE — Patient Instructions (Signed)

## 2014-02-27 NOTE — Progress Notes (Signed)
Clinical Summary Harold York is a 78 y.o.male last seen in June. He reports better control of angina symptoms on current regimen which we modified at the last visit. Still does is outdoor chores, has difficulty with high temperatures however.  Lexiscan Cardiolite done at [facility] in September 2014 demonstrated an area of potential ischemia in the anteroapical/anteroseptal distribution, LVEF 61%. Cardiac catheterization done in October 2014 demonstrated single vessel CAD involving the obtuse marginal branch with widely patent LAD, and RCA. He underwent placement of DES to the obtuse marginal at that time.  We reviewed his medications, he did need a refill on Lopressor.  Allergies  Allergen Reactions  . Lidocaine   . Penicillins     Current Outpatient Prescriptions  Medication Sig Dispense Refill  . albuterol (PROVENTIL HFA;VENTOLIN HFA) 108 (90 BASE) MCG/ACT inhaler Inhale 2 puffs into the lungs every 6 (six) hours as needed for wheezing.  1 Inhaler  2  . amLODipine (NORVASC) 5 MG tablet Take 1 tablet (5 mg total) by mouth daily.  90 tablet  3  . aspirin EC 81 MG EC tablet Take 1 tablet (81 mg total) by mouth daily.  30 tablet  0  . Diclofenac-Misoprostol (ARTHROTEC) 75-0.2 MG TBEC Take 1 tablet by mouth 2 (two) times daily.      . fish oil-omega-3 fatty acids 1000 MG capsule Take 1 g by mouth daily.       . formoterol (FORADIL) 12 MCG capsule for inhaler Place 12 mcg into inhaler and inhale 2 (two) times daily.      Marland Kitchen gabapentin (NEURONTIN) 300 MG capsule Take 300 mg by mouth 3 (three) times daily.      . isosorbide mononitrate (IMDUR) 30 MG 24 hr tablet Take 1 tablet (30 mg total) by mouth 2 (two) times daily.  180 tablet  3  . LORazepam (ATIVAN) 0.5 MG tablet Take 0.5 mg by mouth daily.       . metoprolol (LOPRESSOR) 50 MG tablet Take 1 tablet (50 mg total) by mouth 2 (two) times daily.  60 tablet  6  . nitroGLYCERIN (NITROSTAT) 0.4 MG SL tablet Place 1 tablet (0.4 mg total) under  the tongue every 5 (five) minutes as needed for chest pain.  25 tablet  3  . omeprazole (PRILOSEC) 40 MG capsule Take 40 mg by mouth daily.      . pravastatin (PRAVACHOL) 40 MG tablet Take 1 tablet (40 mg total) by mouth every evening.  90 tablet  3  . tiotropium (SPIRIVA) 18 MCG inhalation capsule Place 18 mcg into inhaler and inhale daily.       No current facility-administered medications for this visit.    Past Medical History  Diagnosis Date  . Essential hypertension, benign   . Asthma   . COPD (chronic obstructive pulmonary disease)   . Cerebral meningioma   . Nephrolithiasis   . Mixed hyperlipidemia   . History of pneumonia   . Sleep apnea     Unable to tolerate CPAP  . GERD (gastroesophageal reflux disease)   . Arthritis   . Prostate cancer   . Bladder cancer   . Coronary atherosclerosis of native coronary artery     DES to OM October 2014    Social History Mr. Omalley reports that he quit smoking about 26 years ago. His smoking use included Cigarettes. He has a 140 pack-year smoking history. He has never used smokeless tobacco. Mr. Rekowski reports that he does not drink alcohol.  Review  of Systems No palpitations, dizziness, syncope. No claudication. Chronic shortness of breath. Other systems reviewed and negative.  Physical Examination Filed Vitals:   02/27/14 0813  BP: 148/78  Pulse: 62   Filed Weights   02/27/14 0813  Weight: 264 lb (119.75 kg)    Obese male, no acute distress.  HEENT: Conjunctiva and lids normal, oropharynx clear.  Neck: Supple, increased girth, no carotid bruits, no thyromegaly.  Lungs: Diminished but clear to auscultation, nonlabored breathing at rest.  Cardiac: Regular rate and rhythm, no S3 or significant systolic murmur, no pericardial rub.  Abdomen: Soft, nontender, protuberant, bowel sounds present, no guarding or rebound.  Extremities: 1+ edema, distal pulses 2+.  Skin: Warm and dry.  Musculoskeletal: No kyphosis.    Neuropsychiatric: Alert and oriented x3, affect grossly appropriate.    Problem List and Plan   Coronary atherosclerosis of native coronary artery Currently stable symptomatically on present medical regimen. No changes made today. Followup arranged for 6 months.  Essential hypertension, benign Continue current medications and keep followup with Dr. Sherrie Sport.    Satira Sark, M.D., F.A.C.C.

## 2014-02-27 NOTE — Assessment & Plan Note (Signed)
Currently stable symptomatically on present medical regimen. No changes made today. Followup arranged for 6 months.

## 2014-03-05 DIAGNOSIS — Z87891 Personal history of nicotine dependence: Secondary | ICD-10-CM | POA: Diagnosis not present

## 2014-03-05 DIAGNOSIS — F3289 Other specified depressive episodes: Secondary | ICD-10-CM | POA: Diagnosis not present

## 2014-03-05 DIAGNOSIS — I10 Essential (primary) hypertension: Secondary | ICD-10-CM | POA: Diagnosis not present

## 2014-03-05 DIAGNOSIS — Z880 Allergy status to penicillin: Secondary | ICD-10-CM | POA: Diagnosis not present

## 2014-03-05 DIAGNOSIS — F329 Major depressive disorder, single episode, unspecified: Secondary | ICD-10-CM | POA: Diagnosis not present

## 2014-03-05 DIAGNOSIS — E785 Hyperlipidemia, unspecified: Secondary | ICD-10-CM | POA: Diagnosis present

## 2014-03-05 DIAGNOSIS — Z884 Allergy status to anesthetic agent status: Secondary | ICD-10-CM | POA: Diagnosis not present

## 2014-03-05 DIAGNOSIS — J9819 Other pulmonary collapse: Secondary | ICD-10-CM | POA: Diagnosis not present

## 2014-03-05 DIAGNOSIS — I252 Old myocardial infarction: Secondary | ICD-10-CM | POA: Diagnosis not present

## 2014-03-05 DIAGNOSIS — Z6838 Body mass index (BMI) 38.0-38.9, adult: Secondary | ICD-10-CM | POA: Diagnosis not present

## 2014-03-05 DIAGNOSIS — Z9861 Coronary angioplasty status: Secondary | ICD-10-CM | POA: Diagnosis not present

## 2014-03-05 DIAGNOSIS — I509 Heart failure, unspecified: Secondary | ICD-10-CM | POA: Diagnosis not present

## 2014-03-05 DIAGNOSIS — M1990 Unspecified osteoarthritis, unspecified site: Secondary | ICD-10-CM | POA: Diagnosis present

## 2014-03-05 DIAGNOSIS — Z8551 Personal history of malignant neoplasm of bladder: Secondary | ICD-10-CM | POA: Diagnosis not present

## 2014-03-05 DIAGNOSIS — I2510 Atherosclerotic heart disease of native coronary artery without angina pectoris: Secondary | ICD-10-CM | POA: Diagnosis present

## 2014-03-05 DIAGNOSIS — Z800 Family history of malignant neoplasm of digestive organs: Secondary | ICD-10-CM | POA: Diagnosis not present

## 2014-03-05 DIAGNOSIS — Z801 Family history of malignant neoplasm of trachea, bronchus and lung: Secondary | ICD-10-CM | POA: Diagnosis not present

## 2014-03-05 DIAGNOSIS — J441 Chronic obstructive pulmonary disease with (acute) exacerbation: Secondary | ICD-10-CM | POA: Diagnosis not present

## 2014-03-05 DIAGNOSIS — J9600 Acute respiratory failure, unspecified whether with hypoxia or hypercapnia: Secondary | ICD-10-CM | POA: Diagnosis not present

## 2014-03-05 DIAGNOSIS — K449 Diaphragmatic hernia without obstruction or gangrene: Secondary | ICD-10-CM | POA: Diagnosis not present

## 2014-03-05 DIAGNOSIS — Z79899 Other long term (current) drug therapy: Secondary | ICD-10-CM | POA: Diagnosis not present

## 2014-03-06 DIAGNOSIS — K449 Diaphragmatic hernia without obstruction or gangrene: Secondary | ICD-10-CM | POA: Diagnosis not present

## 2014-03-06 DIAGNOSIS — F329 Major depressive disorder, single episode, unspecified: Secondary | ICD-10-CM | POA: Diagnosis not present

## 2014-03-06 DIAGNOSIS — J441 Chronic obstructive pulmonary disease with (acute) exacerbation: Secondary | ICD-10-CM | POA: Diagnosis not present

## 2014-03-06 DIAGNOSIS — J9600 Acute respiratory failure, unspecified whether with hypoxia or hypercapnia: Secondary | ICD-10-CM | POA: Diagnosis not present

## 2014-03-06 DIAGNOSIS — I10 Essential (primary) hypertension: Secondary | ICD-10-CM | POA: Diagnosis not present

## 2014-03-08 DIAGNOSIS — K449 Diaphragmatic hernia without obstruction or gangrene: Secondary | ICD-10-CM | POA: Diagnosis not present

## 2014-03-08 DIAGNOSIS — J441 Chronic obstructive pulmonary disease with (acute) exacerbation: Secondary | ICD-10-CM | POA: Diagnosis not present

## 2014-03-08 DIAGNOSIS — I10 Essential (primary) hypertension: Secondary | ICD-10-CM | POA: Diagnosis not present

## 2014-03-08 DIAGNOSIS — F329 Major depressive disorder, single episode, unspecified: Secondary | ICD-10-CM | POA: Diagnosis not present

## 2014-03-08 DIAGNOSIS — J9600 Acute respiratory failure, unspecified whether with hypoxia or hypercapnia: Secondary | ICD-10-CM | POA: Diagnosis not present

## 2014-03-11 DIAGNOSIS — I2510 Atherosclerotic heart disease of native coronary artery without angina pectoris: Secondary | ICD-10-CM | POA: Diagnosis not present

## 2014-03-11 DIAGNOSIS — G629 Polyneuropathy, unspecified: Secondary | ICD-10-CM | POA: Diagnosis not present

## 2014-03-11 DIAGNOSIS — I509 Heart failure, unspecified: Secondary | ICD-10-CM | POA: Diagnosis not present

## 2014-03-11 DIAGNOSIS — G609 Hereditary and idiopathic neuropathy, unspecified: Secondary | ICD-10-CM | POA: Diagnosis not present

## 2014-03-11 DIAGNOSIS — I10 Essential (primary) hypertension: Secondary | ICD-10-CM | POA: Diagnosis not present

## 2014-03-11 DIAGNOSIS — J441 Chronic obstructive pulmonary disease with (acute) exacerbation: Secondary | ICD-10-CM | POA: Diagnosis not present

## 2014-03-12 DIAGNOSIS — J449 Chronic obstructive pulmonary disease, unspecified: Secondary | ICD-10-CM | POA: Diagnosis not present

## 2014-03-13 DIAGNOSIS — I2510 Atherosclerotic heart disease of native coronary artery without angina pectoris: Secondary | ICD-10-CM | POA: Diagnosis not present

## 2014-03-13 DIAGNOSIS — J441 Chronic obstructive pulmonary disease with (acute) exacerbation: Secondary | ICD-10-CM | POA: Diagnosis not present

## 2014-03-13 DIAGNOSIS — G629 Polyneuropathy, unspecified: Secondary | ICD-10-CM | POA: Diagnosis not present

## 2014-03-13 DIAGNOSIS — I10 Essential (primary) hypertension: Secondary | ICD-10-CM | POA: Diagnosis not present

## 2014-03-13 DIAGNOSIS — I509 Heart failure, unspecified: Secondary | ICD-10-CM | POA: Diagnosis not present

## 2014-03-14 DIAGNOSIS — I10 Essential (primary) hypertension: Secondary | ICD-10-CM | POA: Diagnosis not present

## 2014-03-14 DIAGNOSIS — J441 Chronic obstructive pulmonary disease with (acute) exacerbation: Secondary | ICD-10-CM | POA: Diagnosis not present

## 2014-03-14 DIAGNOSIS — G629 Polyneuropathy, unspecified: Secondary | ICD-10-CM | POA: Diagnosis not present

## 2014-03-14 DIAGNOSIS — I2510 Atherosclerotic heart disease of native coronary artery without angina pectoris: Secondary | ICD-10-CM | POA: Diagnosis not present

## 2014-03-14 DIAGNOSIS — I509 Heart failure, unspecified: Secondary | ICD-10-CM | POA: Diagnosis not present

## 2014-03-18 DIAGNOSIS — J441 Chronic obstructive pulmonary disease with (acute) exacerbation: Secondary | ICD-10-CM | POA: Diagnosis not present

## 2014-03-18 DIAGNOSIS — I10 Essential (primary) hypertension: Secondary | ICD-10-CM | POA: Diagnosis not present

## 2014-03-18 DIAGNOSIS — I2510 Atherosclerotic heart disease of native coronary artery without angina pectoris: Secondary | ICD-10-CM | POA: Diagnosis not present

## 2014-03-18 DIAGNOSIS — G629 Polyneuropathy, unspecified: Secondary | ICD-10-CM | POA: Diagnosis not present

## 2014-03-18 DIAGNOSIS — I509 Heart failure, unspecified: Secondary | ICD-10-CM | POA: Diagnosis not present

## 2014-03-19 DIAGNOSIS — J441 Chronic obstructive pulmonary disease with (acute) exacerbation: Secondary | ICD-10-CM | POA: Diagnosis not present

## 2014-03-19 DIAGNOSIS — G629 Polyneuropathy, unspecified: Secondary | ICD-10-CM | POA: Diagnosis not present

## 2014-03-19 DIAGNOSIS — I2510 Atherosclerotic heart disease of native coronary artery without angina pectoris: Secondary | ICD-10-CM | POA: Diagnosis not present

## 2014-03-19 DIAGNOSIS — I509 Heart failure, unspecified: Secondary | ICD-10-CM | POA: Diagnosis not present

## 2014-03-19 DIAGNOSIS — I10 Essential (primary) hypertension: Secondary | ICD-10-CM | POA: Diagnosis not present

## 2014-03-21 DIAGNOSIS — G629 Polyneuropathy, unspecified: Secondary | ICD-10-CM | POA: Diagnosis not present

## 2014-03-21 DIAGNOSIS — I509 Heart failure, unspecified: Secondary | ICD-10-CM | POA: Diagnosis not present

## 2014-03-21 DIAGNOSIS — I10 Essential (primary) hypertension: Secondary | ICD-10-CM | POA: Diagnosis not present

## 2014-03-21 DIAGNOSIS — I2510 Atherosclerotic heart disease of native coronary artery without angina pectoris: Secondary | ICD-10-CM | POA: Diagnosis not present

## 2014-03-21 DIAGNOSIS — J441 Chronic obstructive pulmonary disease with (acute) exacerbation: Secondary | ICD-10-CM | POA: Diagnosis not present

## 2014-03-25 DIAGNOSIS — I509 Heart failure, unspecified: Secondary | ICD-10-CM | POA: Diagnosis not present

## 2014-03-25 DIAGNOSIS — I2510 Atherosclerotic heart disease of native coronary artery without angina pectoris: Secondary | ICD-10-CM | POA: Diagnosis not present

## 2014-03-25 DIAGNOSIS — I10 Essential (primary) hypertension: Secondary | ICD-10-CM | POA: Diagnosis not present

## 2014-03-25 DIAGNOSIS — G629 Polyneuropathy, unspecified: Secondary | ICD-10-CM | POA: Diagnosis not present

## 2014-03-25 DIAGNOSIS — J441 Chronic obstructive pulmonary disease with (acute) exacerbation: Secondary | ICD-10-CM | POA: Diagnosis not present

## 2014-03-27 DIAGNOSIS — J441 Chronic obstructive pulmonary disease with (acute) exacerbation: Secondary | ICD-10-CM | POA: Diagnosis not present

## 2014-03-27 DIAGNOSIS — I10 Essential (primary) hypertension: Secondary | ICD-10-CM | POA: Diagnosis not present

## 2014-03-27 DIAGNOSIS — G629 Polyneuropathy, unspecified: Secondary | ICD-10-CM | POA: Diagnosis not present

## 2014-03-27 DIAGNOSIS — I509 Heart failure, unspecified: Secondary | ICD-10-CM | POA: Diagnosis not present

## 2014-03-27 DIAGNOSIS — I2510 Atherosclerotic heart disease of native coronary artery without angina pectoris: Secondary | ICD-10-CM | POA: Diagnosis not present

## 2014-03-29 DIAGNOSIS — I509 Heart failure, unspecified: Secondary | ICD-10-CM | POA: Diagnosis not present

## 2014-03-29 DIAGNOSIS — J441 Chronic obstructive pulmonary disease with (acute) exacerbation: Secondary | ICD-10-CM | POA: Diagnosis not present

## 2014-03-29 DIAGNOSIS — I2510 Atherosclerotic heart disease of native coronary artery without angina pectoris: Secondary | ICD-10-CM | POA: Diagnosis not present

## 2014-03-29 DIAGNOSIS — I10 Essential (primary) hypertension: Secondary | ICD-10-CM | POA: Diagnosis not present

## 2014-03-29 DIAGNOSIS — G629 Polyneuropathy, unspecified: Secondary | ICD-10-CM | POA: Diagnosis not present

## 2014-04-01 DIAGNOSIS — I10 Essential (primary) hypertension: Secondary | ICD-10-CM | POA: Diagnosis not present

## 2014-04-01 DIAGNOSIS — G629 Polyneuropathy, unspecified: Secondary | ICD-10-CM | POA: Diagnosis not present

## 2014-04-01 DIAGNOSIS — I2510 Atherosclerotic heart disease of native coronary artery without angina pectoris: Secondary | ICD-10-CM | POA: Diagnosis not present

## 2014-04-01 DIAGNOSIS — I509 Heart failure, unspecified: Secondary | ICD-10-CM | POA: Diagnosis not present

## 2014-04-01 DIAGNOSIS — J441 Chronic obstructive pulmonary disease with (acute) exacerbation: Secondary | ICD-10-CM | POA: Diagnosis not present

## 2014-04-03 DIAGNOSIS — I509 Heart failure, unspecified: Secondary | ICD-10-CM | POA: Diagnosis not present

## 2014-04-03 DIAGNOSIS — J441 Chronic obstructive pulmonary disease with (acute) exacerbation: Secondary | ICD-10-CM | POA: Diagnosis not present

## 2014-04-03 DIAGNOSIS — G629 Polyneuropathy, unspecified: Secondary | ICD-10-CM | POA: Diagnosis not present

## 2014-04-03 DIAGNOSIS — I10 Essential (primary) hypertension: Secondary | ICD-10-CM | POA: Diagnosis not present

## 2014-04-03 DIAGNOSIS — I2510 Atherosclerotic heart disease of native coronary artery without angina pectoris: Secondary | ICD-10-CM | POA: Diagnosis not present

## 2014-04-05 DIAGNOSIS — I2510 Atherosclerotic heart disease of native coronary artery without angina pectoris: Secondary | ICD-10-CM | POA: Diagnosis not present

## 2014-04-05 DIAGNOSIS — I10 Essential (primary) hypertension: Secondary | ICD-10-CM | POA: Diagnosis not present

## 2014-04-05 DIAGNOSIS — I509 Heart failure, unspecified: Secondary | ICD-10-CM | POA: Diagnosis not present

## 2014-04-05 DIAGNOSIS — J441 Chronic obstructive pulmonary disease with (acute) exacerbation: Secondary | ICD-10-CM | POA: Diagnosis not present

## 2014-04-05 DIAGNOSIS — G629 Polyneuropathy, unspecified: Secondary | ICD-10-CM | POA: Diagnosis not present

## 2014-04-09 DIAGNOSIS — I2510 Atherosclerotic heart disease of native coronary artery without angina pectoris: Secondary | ICD-10-CM | POA: Diagnosis not present

## 2014-04-09 DIAGNOSIS — I509 Heart failure, unspecified: Secondary | ICD-10-CM | POA: Diagnosis not present

## 2014-04-09 DIAGNOSIS — J441 Chronic obstructive pulmonary disease with (acute) exacerbation: Secondary | ICD-10-CM | POA: Diagnosis not present

## 2014-04-09 DIAGNOSIS — G629 Polyneuropathy, unspecified: Secondary | ICD-10-CM | POA: Diagnosis not present

## 2014-04-09 DIAGNOSIS — I10 Essential (primary) hypertension: Secondary | ICD-10-CM | POA: Diagnosis not present

## 2014-04-10 DIAGNOSIS — I10 Essential (primary) hypertension: Secondary | ICD-10-CM | POA: Diagnosis not present

## 2014-04-10 DIAGNOSIS — J441 Chronic obstructive pulmonary disease with (acute) exacerbation: Secondary | ICD-10-CM | POA: Diagnosis not present

## 2014-04-10 DIAGNOSIS — G629 Polyneuropathy, unspecified: Secondary | ICD-10-CM | POA: Diagnosis not present

## 2014-04-10 DIAGNOSIS — I2510 Atherosclerotic heart disease of native coronary artery without angina pectoris: Secondary | ICD-10-CM | POA: Diagnosis not present

## 2014-04-10 DIAGNOSIS — I509 Heart failure, unspecified: Secondary | ICD-10-CM | POA: Diagnosis not present

## 2014-04-12 DIAGNOSIS — G629 Polyneuropathy, unspecified: Secondary | ICD-10-CM | POA: Diagnosis not present

## 2014-04-12 DIAGNOSIS — I509 Heart failure, unspecified: Secondary | ICD-10-CM | POA: Diagnosis not present

## 2014-04-12 DIAGNOSIS — J441 Chronic obstructive pulmonary disease with (acute) exacerbation: Secondary | ICD-10-CM | POA: Diagnosis not present

## 2014-04-12 DIAGNOSIS — I10 Essential (primary) hypertension: Secondary | ICD-10-CM | POA: Diagnosis not present

## 2014-04-12 DIAGNOSIS — I2510 Atherosclerotic heart disease of native coronary artery without angina pectoris: Secondary | ICD-10-CM | POA: Diagnosis not present

## 2014-04-15 DIAGNOSIS — I509 Heart failure, unspecified: Secondary | ICD-10-CM | POA: Diagnosis not present

## 2014-04-15 DIAGNOSIS — I2510 Atherosclerotic heart disease of native coronary artery without angina pectoris: Secondary | ICD-10-CM | POA: Diagnosis not present

## 2014-04-15 DIAGNOSIS — J441 Chronic obstructive pulmonary disease with (acute) exacerbation: Secondary | ICD-10-CM | POA: Diagnosis not present

## 2014-04-15 DIAGNOSIS — I10 Essential (primary) hypertension: Secondary | ICD-10-CM | POA: Diagnosis not present

## 2014-04-15 DIAGNOSIS — G629 Polyneuropathy, unspecified: Secondary | ICD-10-CM | POA: Diagnosis not present

## 2014-04-16 DIAGNOSIS — I2510 Atherosclerotic heart disease of native coronary artery without angina pectoris: Secondary | ICD-10-CM | POA: Diagnosis not present

## 2014-04-16 DIAGNOSIS — G629 Polyneuropathy, unspecified: Secondary | ICD-10-CM | POA: Diagnosis not present

## 2014-04-16 DIAGNOSIS — J441 Chronic obstructive pulmonary disease with (acute) exacerbation: Secondary | ICD-10-CM | POA: Diagnosis not present

## 2014-04-16 DIAGNOSIS — I509 Heart failure, unspecified: Secondary | ICD-10-CM | POA: Diagnosis not present

## 2014-04-16 DIAGNOSIS — I10 Essential (primary) hypertension: Secondary | ICD-10-CM | POA: Diagnosis not present

## 2014-04-17 DIAGNOSIS — I2510 Atherosclerotic heart disease of native coronary artery without angina pectoris: Secondary | ICD-10-CM | POA: Diagnosis not present

## 2014-04-17 DIAGNOSIS — I10 Essential (primary) hypertension: Secondary | ICD-10-CM | POA: Diagnosis not present

## 2014-04-17 DIAGNOSIS — G629 Polyneuropathy, unspecified: Secondary | ICD-10-CM | POA: Diagnosis not present

## 2014-04-17 DIAGNOSIS — I509 Heart failure, unspecified: Secondary | ICD-10-CM | POA: Diagnosis not present

## 2014-04-17 DIAGNOSIS — J441 Chronic obstructive pulmonary disease with (acute) exacerbation: Secondary | ICD-10-CM | POA: Diagnosis not present

## 2014-04-19 DIAGNOSIS — J441 Chronic obstructive pulmonary disease with (acute) exacerbation: Secondary | ICD-10-CM | POA: Diagnosis not present

## 2014-04-19 DIAGNOSIS — I509 Heart failure, unspecified: Secondary | ICD-10-CM | POA: Diagnosis not present

## 2014-04-19 DIAGNOSIS — I10 Essential (primary) hypertension: Secondary | ICD-10-CM | POA: Diagnosis not present

## 2014-04-19 DIAGNOSIS — G629 Polyneuropathy, unspecified: Secondary | ICD-10-CM | POA: Diagnosis not present

## 2014-04-19 DIAGNOSIS — I2510 Atherosclerotic heart disease of native coronary artery without angina pectoris: Secondary | ICD-10-CM | POA: Diagnosis not present

## 2014-05-09 DIAGNOSIS — I10 Essential (primary) hypertension: Secondary | ICD-10-CM | POA: Diagnosis not present

## 2014-05-09 DIAGNOSIS — I509 Heart failure, unspecified: Secondary | ICD-10-CM | POA: Diagnosis not present

## 2014-05-09 DIAGNOSIS — J441 Chronic obstructive pulmonary disease with (acute) exacerbation: Secondary | ICD-10-CM | POA: Diagnosis not present

## 2014-05-09 DIAGNOSIS — G629 Polyneuropathy, unspecified: Secondary | ICD-10-CM | POA: Diagnosis not present

## 2014-05-09 DIAGNOSIS — I2510 Atherosclerotic heart disease of native coronary artery without angina pectoris: Secondary | ICD-10-CM | POA: Diagnosis not present

## 2014-06-06 ENCOUNTER — Encounter (HOSPITAL_COMMUNITY): Payer: Self-pay | Admitting: Cardiovascular Disease

## 2014-06-11 DIAGNOSIS — Z131 Encounter for screening for diabetes mellitus: Secondary | ICD-10-CM | POA: Diagnosis not present

## 2014-06-11 DIAGNOSIS — Z0000 Encounter for general adult medical examination without abnormal findings: Secondary | ICD-10-CM | POA: Diagnosis not present

## 2014-06-11 DIAGNOSIS — J441 Chronic obstructive pulmonary disease with (acute) exacerbation: Secondary | ICD-10-CM | POA: Diagnosis not present

## 2014-06-11 DIAGNOSIS — Z1389 Encounter for screening for other disorder: Secondary | ICD-10-CM | POA: Diagnosis not present

## 2014-06-11 DIAGNOSIS — I10 Essential (primary) hypertension: Secondary | ICD-10-CM | POA: Diagnosis not present

## 2014-07-12 ENCOUNTER — Other Ambulatory Visit: Payer: Self-pay | Admitting: *Deleted

## 2014-07-12 MED ORDER — PRAVASTATIN SODIUM 40 MG PO TABS: 40.0000 mg | ORAL_TABLET | Freq: Every evening | ORAL | Status: DC

## 2014-09-30 ENCOUNTER — Encounter: Payer: Self-pay | Admitting: Cardiology

## 2014-09-30 ENCOUNTER — Ambulatory Visit (INDEPENDENT_AMBULATORY_CARE_PROVIDER_SITE_OTHER): Payer: Medicare Other | Admitting: Cardiology

## 2014-09-30 VITALS — BP 138/84 | HR 65 | Ht 70.0 in | Wt 257.0 lb

## 2014-09-30 DIAGNOSIS — I2510 Atherosclerotic heart disease of native coronary artery without angina pectoris: Secondary | ICD-10-CM | POA: Diagnosis not present

## 2014-09-30 DIAGNOSIS — I10 Essential (primary) hypertension: Secondary | ICD-10-CM | POA: Diagnosis not present

## 2014-09-30 NOTE — Patient Instructions (Signed)
Continue all current medications. Your physician wants you to follow up in: 6 months.  You will receive a reminder letter in the mail one-two months in advance.  If you don't receive a letter, please call our office to schedule the follow up appointment   

## 2014-09-30 NOTE — Progress Notes (Signed)
Cardiology Office Note  Date: 09/30/2014   ID: Harold York, DOB 1935/10/12, MRN [id number]  PCP: Neale Burly, MD  Primary Cardiologist: Rozann Lesches, MD   Chief Complaint  Patient presents with  . Coronary Artery Disease  . Hypertension  . Hyperlipidemia    History of Present Illness: Harold York is a 79 y.o. male last seen in September 2015. He presents for a routine visit. He states that he has used nitroglycerin a few times for chest discomfort that resolved after a single dose. Otherwise remains chronically short of breath at NYHA class III, is on oxygen continuously per Dr. Sherrie Sport. He is functional with only basic ADLs. No other regular exercise regimen.  Lexiscan Cardiolite done at [facility] in September 2014 demonstrated an area of potential ischemia in the anteroapical/anteroseptal distribution, LVEF 61%. Cardiac catheterization done in October 2014 demonstrated single vessel CAD involving the obtuse marginal branch with widely patent LAD, and RCA. He underwent placement of DES to the obtuse marginal at that time.  We reviewed his cardiac medications, no changes made. ECG today is reviewed below.  Past Medical History  Diagnosis Date  . Essential hypertension, benign   . Asthma   . COPD (chronic obstructive pulmonary disease)   . Cerebral meningioma   . Nephrolithiasis   . Mixed hyperlipidemia   . History of pneumonia   . Sleep apnea     Unable to tolerate CPAP  . GERD (gastroesophageal reflux disease)   . Arthritis   . Prostate cancer   . Bladder cancer   . Coronary atherosclerosis of native coronary artery     DES to OM October 2014    Current Outpatient Prescriptions  Medication Sig Dispense Refill  . albuterol (PROVENTIL HFA;VENTOLIN HFA) 108 (90 BASE) MCG/ACT inhaler Inhale 2 puffs into the lungs every 6 (six) hours as needed for wheezing. 1 Inhaler 2  . amLODipine (NORVASC) 5 MG tablet Take 1 tablet (5 mg total) by mouth daily. 90 tablet 3    . aspirin EC 81 MG EC tablet Take 1 tablet (81 mg total) by mouth daily. 30 tablet 0  . Diclofenac-Misoprostol (ARTHROTEC) 75-0.2 MG TBEC Take 1 tablet by mouth 2 (two) times daily.    . fish oil-omega-3 fatty acids 1000 MG capsule Take 1 g by mouth daily.     . formoterol (FORADIL) 12 MCG capsule for inhaler Place 12 mcg into inhaler and inhale 2 (two) times daily.    Marland Kitchen gabapentin (NEURONTIN) 300 MG capsule Take 300 mg by mouth 3 (three) times daily.    . isosorbide mononitrate (IMDUR) 30 MG 24 hr tablet Take 1 tablet (30 mg total) by mouth 2 (two) times daily. 180 tablet 3  . LORazepam (ATIVAN) 0.5 MG tablet Take 0.5 mg by mouth daily.     . metoprolol (LOPRESSOR) 50 MG tablet Take 1 tablet (50 mg total) by mouth 2 (two) times daily. 60 tablet 6  . nitroGLYCERIN (NITROSTAT) 0.4 MG SL tablet Place 1 tablet (0.4 mg total) under the tongue every 5 (five) minutes as needed for chest pain. 25 tablet 3  . omeprazole (PRILOSEC) 40 MG capsule Take 40 mg by mouth daily.    . pravastatin (PRAVACHOL) 40 MG tablet Take 1 tablet (40 mg total) by mouth every evening. 90 tablet 3  . tiotropium (SPIRIVA) 18 MCG inhalation capsule Place 18 mcg into inhaler and inhale daily.     No current facility-administered medications for this visit.    Allergies:  Lidocaine and Penicillins   Social History: The patient  reports that he quit smoking about 27 years ago. His smoking use included Cigarettes. He has a 140 pack-year smoking history. He has never used smokeless tobacco. He reports that he does not drink alcohol or use illicit drugs.   ROS:  Please see the history of present illness. Otherwise, complete review of systems is positive for chronic arthritic pains and leg pains, uses a cane..  All other systems are reviewed and negative.   Physical Exam: VS:  BP 138/84 mmHg  Pulse 65  Ht 5\' 10"  (1.778 m)  Wt 257 lb (116.574 kg)  BMI 36.88 kg/m2  SpO2 98%, BMI Body mass index is 36.88 kg/(m^2).  Wt Readings  from Last 3 Encounters:  09/30/14 257 lb (116.574 kg)  02/27/14 264 lb (119.75 kg)  11/27/13 257 lb 12.8 oz (116.937 kg)     Obese male, no acute distress.  HEENT: Conjunctiva and lids normal, oropharynx clear.  Neck: Supple, increased girth, no carotid bruits, no thyromegaly.  Lungs: Diminished but clear to auscultation, nonlabored breathing at rest.  Cardiac: Regular rate and rhythm, no S3 or significant systolic murmur, no pericardial rub.  Abdomen: Soft, nontender, protuberant, bowel sounds present, no guarding or rebound.  Extremities: 1+ edema, distal pulses 2+.  Skin: Warm and dry.  Musculoskeletal: No kyphosis.  Neuropsychiatric: Alert and oriented x3, affect grossly appropriate.    ECG: ECG is ordered today and reviewed showing normal sinus rhythm with nonspecific T-wave changes.  Assessment and Plan:  1. No progressive angina symptoms on medical therapy with history of CAD status post DES to the obtuse marginal and 2014. Continue current medical regimen.  2. Morbid obesity with sleep apnea, intolerant of CPAP.  3. Chronic dyspnea on exertion, on oxygen per Dr. Sherrie Sport with history of COPD.  Current medicines were reviewed with the patient today.   Orders Placed This Encounter  Procedures  . EKG 12-Lead    Disposition: FU with me in 6 months.   Signed, Satira Sark, MD, [facility] 09/30/2014 3:01 PM    [facility] at [place], [place], [place] [address] Phone: [phone]; Fax: [phone]

## 2015-01-14 DIAGNOSIS — I2510 Atherosclerotic heart disease of native coronary artery without angina pectoris: Secondary | ICD-10-CM | POA: Diagnosis present

## 2015-01-14 DIAGNOSIS — K219 Gastro-esophageal reflux disease without esophagitis: Secondary | ICD-10-CM | POA: Diagnosis not present

## 2015-01-14 DIAGNOSIS — Z8551 Personal history of malignant neoplasm of bladder: Secondary | ICD-10-CM | POA: Diagnosis not present

## 2015-01-14 DIAGNOSIS — D62 Acute posthemorrhagic anemia: Secondary | ICD-10-CM | POA: Diagnosis not present

## 2015-01-14 DIAGNOSIS — Z9981 Dependence on supplemental oxygen: Secondary | ICD-10-CM | POA: Diagnosis not present

## 2015-01-14 DIAGNOSIS — Z800 Family history of malignant neoplasm of digestive organs: Secondary | ICD-10-CM | POA: Diagnosis not present

## 2015-01-14 DIAGNOSIS — R069 Unspecified abnormalities of breathing: Secondary | ICD-10-CM | POA: Diagnosis not present

## 2015-01-14 DIAGNOSIS — Z880 Allergy status to penicillin: Secondary | ICD-10-CM | POA: Diagnosis not present

## 2015-01-14 DIAGNOSIS — M1990 Unspecified osteoarthritis, unspecified site: Secondary | ICD-10-CM | POA: Diagnosis present

## 2015-01-14 DIAGNOSIS — Z6836 Body mass index (BMI) 36.0-36.9, adult: Secondary | ICD-10-CM | POA: Diagnosis not present

## 2015-01-14 DIAGNOSIS — J9602 Acute respiratory failure with hypercapnia: Secondary | ICD-10-CM | POA: Diagnosis not present

## 2015-01-14 DIAGNOSIS — I509 Heart failure, unspecified: Secondary | ICD-10-CM | POA: Diagnosis present

## 2015-01-14 DIAGNOSIS — E785 Hyperlipidemia, unspecified: Secondary | ICD-10-CM | POA: Diagnosis present

## 2015-01-14 DIAGNOSIS — R319 Hematuria, unspecified: Secondary | ICD-10-CM | POA: Diagnosis not present

## 2015-01-14 DIAGNOSIS — J441 Chronic obstructive pulmonary disease with (acute) exacerbation: Secondary | ICD-10-CM | POA: Diagnosis not present

## 2015-01-14 DIAGNOSIS — D494 Neoplasm of unspecified behavior of bladder: Secondary | ICD-10-CM | POA: Diagnosis not present

## 2015-01-14 DIAGNOSIS — R0602 Shortness of breath: Secondary | ICD-10-CM | POA: Diagnosis not present

## 2015-01-14 DIAGNOSIS — Z801 Family history of malignant neoplasm of trachea, bronchus and lung: Secondary | ICD-10-CM | POA: Diagnosis not present

## 2015-01-14 DIAGNOSIS — Z7951 Long term (current) use of inhaled steroids: Secondary | ICD-10-CM | POA: Diagnosis not present

## 2015-01-14 DIAGNOSIS — I10 Essential (primary) hypertension: Secondary | ICD-10-CM | POA: Diagnosis not present

## 2015-01-14 DIAGNOSIS — C679 Malignant neoplasm of bladder, unspecified: Secondary | ICD-10-CM | POA: Diagnosis present

## 2015-01-14 DIAGNOSIS — E119 Type 2 diabetes mellitus without complications: Secondary | ICD-10-CM | POA: Diagnosis not present

## 2015-01-14 DIAGNOSIS — J9601 Acute respiratory failure with hypoxia: Secondary | ICD-10-CM | POA: Diagnosis not present

## 2015-01-14 DIAGNOSIS — N3289 Other specified disorders of bladder: Secondary | ICD-10-CM | POA: Diagnosis not present

## 2015-01-14 DIAGNOSIS — R338 Other retention of urine: Secondary | ICD-10-CM | POA: Diagnosis present

## 2015-01-14 DIAGNOSIS — G4730 Sleep apnea, unspecified: Secondary | ICD-10-CM | POA: Diagnosis present

## 2015-01-14 DIAGNOSIS — Z79899 Other long term (current) drug therapy: Secondary | ICD-10-CM | POA: Diagnosis not present

## 2015-01-14 DIAGNOSIS — K449 Diaphragmatic hernia without obstruction or gangrene: Secondary | ICD-10-CM | POA: Diagnosis present

## 2015-01-14 DIAGNOSIS — R791 Abnormal coagulation profile: Secondary | ICD-10-CM | POA: Diagnosis not present

## 2015-01-14 DIAGNOSIS — R310 Gross hematuria: Secondary | ICD-10-CM | POA: Diagnosis not present

## 2015-01-14 DIAGNOSIS — N281 Cyst of kidney, acquired: Secondary | ICD-10-CM | POA: Diagnosis present

## 2015-01-14 DIAGNOSIS — Z888 Allergy status to other drugs, medicaments and biological substances status: Secondary | ICD-10-CM | POA: Diagnosis not present

## 2015-01-23 DIAGNOSIS — I10 Essential (primary) hypertension: Secondary | ICD-10-CM | POA: Diagnosis not present

## 2015-01-23 DIAGNOSIS — I2510 Atherosclerotic heart disease of native coronary artery without angina pectoris: Secondary | ICD-10-CM | POA: Diagnosis not present

## 2015-01-23 DIAGNOSIS — M150 Primary generalized (osteo)arthritis: Secondary | ICD-10-CM | POA: Diagnosis not present

## 2015-01-23 DIAGNOSIS — J441 Chronic obstructive pulmonary disease with (acute) exacerbation: Secondary | ICD-10-CM | POA: Diagnosis not present

## 2015-01-23 DIAGNOSIS — R339 Retention of urine, unspecified: Secondary | ICD-10-CM | POA: Diagnosis not present

## 2015-01-23 DIAGNOSIS — J9622 Acute and chronic respiratory failure with hypercapnia: Secondary | ICD-10-CM | POA: Diagnosis not present

## 2015-01-23 DIAGNOSIS — Z9981 Dependence on supplemental oxygen: Secondary | ICD-10-CM | POA: Diagnosis not present

## 2015-01-23 DIAGNOSIS — D62 Acute posthemorrhagic anemia: Secondary | ICD-10-CM | POA: Diagnosis not present

## 2015-01-23 DIAGNOSIS — J9621 Acute and chronic respiratory failure with hypoxia: Secondary | ICD-10-CM | POA: Diagnosis not present

## 2015-01-23 DIAGNOSIS — Z87891 Personal history of nicotine dependence: Secondary | ICD-10-CM | POA: Diagnosis not present

## 2015-01-23 DIAGNOSIS — K449 Diaphragmatic hernia without obstruction or gangrene: Secondary | ICD-10-CM | POA: Diagnosis not present

## 2015-01-23 DIAGNOSIS — C679 Malignant neoplasm of bladder, unspecified: Secondary | ICD-10-CM | POA: Diagnosis not present

## 2015-01-27 DIAGNOSIS — J441 Chronic obstructive pulmonary disease with (acute) exacerbation: Secondary | ICD-10-CM | POA: Diagnosis not present

## 2015-01-27 DIAGNOSIS — R310 Gross hematuria: Secondary | ICD-10-CM | POA: Diagnosis not present

## 2015-01-28 DIAGNOSIS — J441 Chronic obstructive pulmonary disease with (acute) exacerbation: Secondary | ICD-10-CM | POA: Diagnosis not present

## 2015-01-28 DIAGNOSIS — J9622 Acute and chronic respiratory failure with hypercapnia: Secondary | ICD-10-CM | POA: Diagnosis not present

## 2015-01-28 DIAGNOSIS — C679 Malignant neoplasm of bladder, unspecified: Secondary | ICD-10-CM | POA: Diagnosis not present

## 2015-01-28 DIAGNOSIS — I2510 Atherosclerotic heart disease of native coronary artery without angina pectoris: Secondary | ICD-10-CM | POA: Diagnosis not present

## 2015-01-28 DIAGNOSIS — J9621 Acute and chronic respiratory failure with hypoxia: Secondary | ICD-10-CM | POA: Diagnosis not present

## 2015-01-28 DIAGNOSIS — I10 Essential (primary) hypertension: Secondary | ICD-10-CM | POA: Diagnosis not present

## 2015-02-01 DIAGNOSIS — J9621 Acute and chronic respiratory failure with hypoxia: Secondary | ICD-10-CM | POA: Diagnosis not present

## 2015-02-01 DIAGNOSIS — C679 Malignant neoplasm of bladder, unspecified: Secondary | ICD-10-CM | POA: Diagnosis not present

## 2015-02-01 DIAGNOSIS — I2510 Atherosclerotic heart disease of native coronary artery without angina pectoris: Secondary | ICD-10-CM | POA: Diagnosis not present

## 2015-02-01 DIAGNOSIS — I10 Essential (primary) hypertension: Secondary | ICD-10-CM | POA: Diagnosis not present

## 2015-02-01 DIAGNOSIS — J441 Chronic obstructive pulmonary disease with (acute) exacerbation: Secondary | ICD-10-CM | POA: Diagnosis not present

## 2015-02-01 DIAGNOSIS — J9622 Acute and chronic respiratory failure with hypercapnia: Secondary | ICD-10-CM | POA: Diagnosis not present

## 2015-02-04 DIAGNOSIS — C679 Malignant neoplasm of bladder, unspecified: Secondary | ICD-10-CM | POA: Diagnosis not present

## 2015-02-04 DIAGNOSIS — I10 Essential (primary) hypertension: Secondary | ICD-10-CM | POA: Diagnosis not present

## 2015-02-04 DIAGNOSIS — I2510 Atherosclerotic heart disease of native coronary artery without angina pectoris: Secondary | ICD-10-CM | POA: Diagnosis not present

## 2015-02-04 DIAGNOSIS — J441 Chronic obstructive pulmonary disease with (acute) exacerbation: Secondary | ICD-10-CM | POA: Diagnosis not present

## 2015-02-04 DIAGNOSIS — J9621 Acute and chronic respiratory failure with hypoxia: Secondary | ICD-10-CM | POA: Diagnosis not present

## 2015-02-04 DIAGNOSIS — J9622 Acute and chronic respiratory failure with hypercapnia: Secondary | ICD-10-CM | POA: Diagnosis not present

## 2015-02-05 DIAGNOSIS — C679 Malignant neoplasm of bladder, unspecified: Secondary | ICD-10-CM | POA: Diagnosis not present

## 2015-02-05 DIAGNOSIS — I10 Essential (primary) hypertension: Secondary | ICD-10-CM | POA: Diagnosis not present

## 2015-02-05 DIAGNOSIS — J9622 Acute and chronic respiratory failure with hypercapnia: Secondary | ICD-10-CM | POA: Diagnosis not present

## 2015-02-05 DIAGNOSIS — J9621 Acute and chronic respiratory failure with hypoxia: Secondary | ICD-10-CM | POA: Diagnosis not present

## 2015-02-05 DIAGNOSIS — I2510 Atherosclerotic heart disease of native coronary artery without angina pectoris: Secondary | ICD-10-CM | POA: Diagnosis not present

## 2015-02-05 DIAGNOSIS — J441 Chronic obstructive pulmonary disease with (acute) exacerbation: Secondary | ICD-10-CM | POA: Diagnosis not present

## 2015-02-13 DIAGNOSIS — I2510 Atherosclerotic heart disease of native coronary artery without angina pectoris: Secondary | ICD-10-CM | POA: Diagnosis not present

## 2015-02-13 DIAGNOSIS — J9622 Acute and chronic respiratory failure with hypercapnia: Secondary | ICD-10-CM | POA: Diagnosis not present

## 2015-02-13 DIAGNOSIS — C679 Malignant neoplasm of bladder, unspecified: Secondary | ICD-10-CM | POA: Diagnosis not present

## 2015-02-13 DIAGNOSIS — J9621 Acute and chronic respiratory failure with hypoxia: Secondary | ICD-10-CM | POA: Diagnosis not present

## 2015-02-13 DIAGNOSIS — I10 Essential (primary) hypertension: Secondary | ICD-10-CM | POA: Diagnosis not present

## 2015-02-13 DIAGNOSIS — J441 Chronic obstructive pulmonary disease with (acute) exacerbation: Secondary | ICD-10-CM | POA: Diagnosis not present

## 2015-02-18 DIAGNOSIS — J9622 Acute and chronic respiratory failure with hypercapnia: Secondary | ICD-10-CM | POA: Diagnosis not present

## 2015-02-18 DIAGNOSIS — I10 Essential (primary) hypertension: Secondary | ICD-10-CM | POA: Diagnosis not present

## 2015-02-18 DIAGNOSIS — I2510 Atherosclerotic heart disease of native coronary artery without angina pectoris: Secondary | ICD-10-CM | POA: Diagnosis not present

## 2015-02-18 DIAGNOSIS — J441 Chronic obstructive pulmonary disease with (acute) exacerbation: Secondary | ICD-10-CM | POA: Diagnosis not present

## 2015-02-18 DIAGNOSIS — J9621 Acute and chronic respiratory failure with hypoxia: Secondary | ICD-10-CM | POA: Diagnosis not present

## 2015-02-18 DIAGNOSIS — C679 Malignant neoplasm of bladder, unspecified: Secondary | ICD-10-CM | POA: Diagnosis not present

## 2015-02-27 DIAGNOSIS — J9621 Acute and chronic respiratory failure with hypoxia: Secondary | ICD-10-CM | POA: Diagnosis not present

## 2015-02-27 DIAGNOSIS — J9622 Acute and chronic respiratory failure with hypercapnia: Secondary | ICD-10-CM | POA: Diagnosis not present

## 2015-02-27 DIAGNOSIS — C679 Malignant neoplasm of bladder, unspecified: Secondary | ICD-10-CM | POA: Diagnosis not present

## 2015-02-27 DIAGNOSIS — I2510 Atherosclerotic heart disease of native coronary artery without angina pectoris: Secondary | ICD-10-CM | POA: Diagnosis not present

## 2015-02-27 DIAGNOSIS — J441 Chronic obstructive pulmonary disease with (acute) exacerbation: Secondary | ICD-10-CM | POA: Diagnosis not present

## 2015-02-27 DIAGNOSIS — I10 Essential (primary) hypertension: Secondary | ICD-10-CM | POA: Diagnosis not present

## 2015-03-21 DIAGNOSIS — J9622 Acute and chronic respiratory failure with hypercapnia: Secondary | ICD-10-CM | POA: Diagnosis not present

## 2015-03-21 DIAGNOSIS — J9621 Acute and chronic respiratory failure with hypoxia: Secondary | ICD-10-CM | POA: Diagnosis not present

## 2015-03-21 DIAGNOSIS — I2510 Atherosclerotic heart disease of native coronary artery without angina pectoris: Secondary | ICD-10-CM | POA: Diagnosis not present

## 2015-03-21 DIAGNOSIS — C679 Malignant neoplasm of bladder, unspecified: Secondary | ICD-10-CM | POA: Diagnosis not present

## 2015-03-21 DIAGNOSIS — J441 Chronic obstructive pulmonary disease with (acute) exacerbation: Secondary | ICD-10-CM | POA: Diagnosis not present

## 2015-03-21 DIAGNOSIS — I10 Essential (primary) hypertension: Secondary | ICD-10-CM | POA: Diagnosis not present

## 2015-04-22 ENCOUNTER — Encounter: Payer: Self-pay | Admitting: Cardiology

## 2015-04-22 ENCOUNTER — Encounter: Payer: Medicare Other | Admitting: Cardiology

## 2015-04-22 NOTE — Progress Notes (Signed)
No show  This encounter was created in error - please disregard.

## 2015-04-29 DIAGNOSIS — J441 Chronic obstructive pulmonary disease with (acute) exacerbation: Secondary | ICD-10-CM | POA: Diagnosis not present

## 2015-04-29 DIAGNOSIS — I25118 Atherosclerotic heart disease of native coronary artery with other forms of angina pectoris: Secondary | ICD-10-CM | POA: Diagnosis not present

## 2015-04-29 DIAGNOSIS — M545 Low back pain: Secondary | ICD-10-CM | POA: Diagnosis not present

## 2015-04-29 DIAGNOSIS — F5101 Primary insomnia: Secondary | ICD-10-CM | POA: Diagnosis not present

## 2015-05-21 ENCOUNTER — Ambulatory Visit (INDEPENDENT_AMBULATORY_CARE_PROVIDER_SITE_OTHER): Payer: Medicare Other | Admitting: Cardiology

## 2015-05-21 ENCOUNTER — Encounter: Payer: Self-pay | Admitting: Cardiology

## 2015-05-21 VITALS — BP 124/72 | HR 88 | Ht 70.0 in | Wt 262.6 lb

## 2015-05-21 DIAGNOSIS — I10 Essential (primary) hypertension: Secondary | ICD-10-CM

## 2015-05-21 DIAGNOSIS — I2510 Atherosclerotic heart disease of native coronary artery without angina pectoris: Secondary | ICD-10-CM | POA: Diagnosis not present

## 2015-05-21 NOTE — Progress Notes (Signed)
Cardiology Office Note  Date: 05/21/2015   ID: Harold York, DOB April 29, 1936, MRN [id number]  PCP: Neale Burly, MD  Primary Cardiologist: Rozann Lesches, MD   Chief Complaint  Patient presents with  . Coronary Artery Disease    History of Present Illness: Harold York is a 79 y.o. male last seen in April. He presents for a routine follow-up visit. He reports no problems with angina symptoms, has not required any nitroglycerin use. He uses oxygen for COPD, otherwise is able to take care of his ADLs including cooking. He plans a large Thanksgiving meal tomorrow for friends.  We reviewed his medications which are outlined below. He continues on aspirin, Pravachol, Lopressor, Imdur, Norvasc, and as needed nitroglycerin. He sees Dr. Sherrie Sport on a three-month basis for follow-up.   Past Medical History  Diagnosis Date  . Essential hypertension, benign   . Asthma   . COPD (chronic obstructive pulmonary disease) ([place])   . Cerebral meningioma ([place])   . Nephrolithiasis   . Mixed hyperlipidemia   . History of pneumonia   . Sleep apnea     Unable to tolerate CPAP  . GERD (gastroesophageal reflux disease)   . Arthritis   . Prostate cancer ([place])   . Bladder cancer ([place])   . Coronary atherosclerosis of native coronary artery     DES to OM October 2014    Current Outpatient Prescriptions  Medication Sig Dispense Refill  . albuterol (PROVENTIL HFA;VENTOLIN HFA) 108 (90 BASE) MCG/ACT inhaler Inhale 2 puffs into the lungs every 6 (six) hours as needed for wheezing. 1 Inhaler 2  . amLODipine (NORVASC) 5 MG tablet Take 1 tablet (5 mg total) by mouth daily. 90 tablet 3  . aspirin EC 81 MG EC tablet Take 1 tablet (81 mg total) by mouth daily. 30 tablet 0  . Diclofenac-Misoprostol (ARTHROTEC) 75-0.2 MG TBEC Take 1 tablet by mouth 2 (two) times daily.    . fish oil-omega-3 fatty acids 1000 MG capsule Take 1 g by mouth daily.     . formoterol (FORADIL) 12 MCG capsule for inhaler  Place 12 mcg into inhaler and inhale 2 (two) times daily.    Marland Kitchen gabapentin (NEURONTIN) 300 MG capsule Take 300 mg by mouth 3 (three) times daily.    . isosorbide mononitrate (IMDUR) 30 MG 24 hr tablet Take 1 tablet (30 mg total) by mouth 2 (two) times daily. 180 tablet 3  . LORazepam (ATIVAN) 0.5 MG tablet Take 0.5 mg by mouth daily.     . metoprolol (LOPRESSOR) 50 MG tablet Take 1 tablet (50 mg total) by mouth 2 (two) times daily. 60 tablet 6  . nitroGLYCERIN (NITROSTAT) 0.4 MG SL tablet Place 1 tablet (0.4 mg total) under the tongue every 5 (five) minutes as needed for chest pain. 25 tablet 3  . omeprazole (PRILOSEC) 40 MG capsule Take 40 mg by mouth daily.    Marland Kitchen oxazepam (SERAX) 10 MG capsule Take 1 capsule by mouth at bedtime.    . pravastatin (PRAVACHOL) 40 MG tablet Take 1 tablet (40 mg total) by mouth every evening. 90 tablet 3  . SEREVENT DISKUS 50 MCG/DOSE diskus inhaler Inhale 1 puff into the lungs 2 (two) times daily.    Marland Kitchen tiotropium (SPIRIVA) 18 MCG inhalation capsule Place 18 mcg into inhaler and inhale daily.     No current facility-administered medications for this visit.    Allergies:  Lidocaine and Penicillins   Social History: The patient  reports that  he quit smoking about 27 years ago. His smoking use included Cigarettes. He has a 140 pack-year smoking history. He has never used smokeless tobacco. He reports that he does not drink alcohol or use illicit drugs.   ROS:  Please see the history of present illness. Otherwise, complete review of systems is positive for chronic dyspnea on exertion.  All other systems are reviewed and negative.   Physical Exam: VS:  BP 124/72 mmHg  Pulse 88  Ht 5\' 10"  (1.778 m)  Wt 262 lb 9.6 oz (119.115 kg)  BMI 37.68 kg/m2  SpO2 90%, BMI Body mass index is 37.68 kg/(m^2).  Wt Readings from Last 3 Encounters:  05/21/15 262 lb 9.6 oz (119.115 kg)  09/30/14 257 lb (116.574 kg)  02/27/14 264 lb (119.75 kg)     Obese male, no acute distress.   HEENT: Conjunctiva and lids normal, oropharynx clear.  Neck: Supple, increased girth, no carotid bruits, no thyromegaly.  Lungs: Diminished but clear to auscultation, nonlabored breathing at rest.  Cardiac: Regular rate and rhythm, no S3 or significant systolic murmur, no pericardial rub.  Abdomen: Soft, nontender, protuberant, bowel sounds present, no guarding or rebound.  Extremities: 1+ edema, distal pulses 2+.    ECG: Tracing from 09/30/2014 showed normal sinus rhythm with nonspecific T-wave changes.   Recent Labwork:  May 2015: BUN 19, creatinine 1.0, potassium 4.1, hemoglobin 13.8, platelet 173  Other Studies Reviewed Today:  Lexiscan Cardiolite done at [facility] in September 2014 demonstrated an area of potential ischemia in the anteroapical/anteroseptal distribution, LVEF 61%. Cardiac catheterization done in October 2014 demonstrated single vessel CAD involving the obtuse marginal branch with widely patent LAD, and RCA. He underwent placement of DES to the obtuse marginal at that time.  Assessment and Plan:  1. Symptomatically stable CAD status post DES to the obtuse marginal in 2014. We will continue medical therapy and observation for now. No changes made in current regimen.  2. Essential hypertension, blood pressure is well controlled today.  Current medicines were reviewed with the patient today.  Disposition: FU with me in 6 months.   Signed, Satira Sark, MD, [facility] 05/21/2015 11:52 AM    [place] at [place], [place], [place] [address] Phone: [phone]; Fax: [phone]

## 2015-05-21 NOTE — Patient Instructions (Signed)
Your physician recommends that you continue on your current medications as directed. Please refer to the Current Medication list given to you today. Your physician recommends that you schedule a follow-up appointment in: 6 months. You will receive a reminder letter in the mail in about 4 months reminding you to call and schedule your appointment. If you don't receive this letter, please contact our office. 

## 2015-06-21 ENCOUNTER — Other Ambulatory Visit: Payer: Self-pay | Admitting: Cardiology

## 2015-07-06 DIAGNOSIS — Z888 Allergy status to other drugs, medicaments and biological substances status: Secondary | ICD-10-CM | POA: Diagnosis not present

## 2015-07-06 DIAGNOSIS — I2510 Atherosclerotic heart disease of native coronary artery without angina pectoris: Secondary | ICD-10-CM | POA: Diagnosis not present

## 2015-07-06 DIAGNOSIS — C679 Malignant neoplasm of bladder, unspecified: Secondary | ICD-10-CM | POA: Diagnosis not present

## 2015-07-06 DIAGNOSIS — Z8546 Personal history of malignant neoplasm of prostate: Secondary | ICD-10-CM | POA: Diagnosis not present

## 2015-07-06 DIAGNOSIS — Z66 Do not resuscitate: Secondary | ICD-10-CM | POA: Diagnosis present

## 2015-07-06 DIAGNOSIS — C801 Malignant (primary) neoplasm, unspecified: Secondary | ICD-10-CM | POA: Diagnosis not present

## 2015-07-06 DIAGNOSIS — Z87891 Personal history of nicotine dependence: Secondary | ICD-10-CM | POA: Diagnosis not present

## 2015-07-06 DIAGNOSIS — N390 Urinary tract infection, site not specified: Secondary | ICD-10-CM | POA: Diagnosis not present

## 2015-07-06 DIAGNOSIS — R310 Gross hematuria: Secondary | ICD-10-CM | POA: Diagnosis not present

## 2015-07-06 DIAGNOSIS — Z6836 Body mass index (BMI) 36.0-36.9, adult: Secondary | ICD-10-CM | POA: Diagnosis not present

## 2015-07-06 DIAGNOSIS — M170 Bilateral primary osteoarthritis of knee: Secondary | ICD-10-CM | POA: Diagnosis present

## 2015-07-06 DIAGNOSIS — Z7951 Long term (current) use of inhaled steroids: Secondary | ICD-10-CM | POA: Diagnosis not present

## 2015-07-06 DIAGNOSIS — J449 Chronic obstructive pulmonary disease, unspecified: Secondary | ICD-10-CM | POA: Diagnosis not present

## 2015-07-06 DIAGNOSIS — Z23 Encounter for immunization: Secondary | ICD-10-CM | POA: Diagnosis not present

## 2015-07-06 DIAGNOSIS — Z9981 Dependence on supplemental oxygen: Secondary | ICD-10-CM | POA: Diagnosis not present

## 2015-07-06 DIAGNOSIS — I10 Essential (primary) hypertension: Secondary | ICD-10-CM | POA: Diagnosis not present

## 2015-07-06 DIAGNOSIS — E669 Obesity, unspecified: Secondary | ICD-10-CM | POA: Diagnosis present

## 2015-07-06 DIAGNOSIS — R030 Elevated blood-pressure reading, without diagnosis of hypertension: Secondary | ICD-10-CM | POA: Diagnosis not present

## 2015-07-06 DIAGNOSIS — D500 Iron deficiency anemia secondary to blood loss (chronic): Secondary | ICD-10-CM | POA: Diagnosis not present

## 2015-07-06 DIAGNOSIS — Z880 Allergy status to penicillin: Secondary | ICD-10-CM | POA: Diagnosis not present

## 2015-07-06 DIAGNOSIS — D62 Acute posthemorrhagic anemia: Secondary | ICD-10-CM | POA: Diagnosis not present

## 2015-07-06 DIAGNOSIS — G4730 Sleep apnea, unspecified: Secondary | ICD-10-CM | POA: Diagnosis present

## 2015-07-06 DIAGNOSIS — K219 Gastro-esophageal reflux disease without esophagitis: Secondary | ICD-10-CM | POA: Diagnosis present

## 2015-07-06 DIAGNOSIS — K449 Diaphragmatic hernia without obstruction or gangrene: Secondary | ICD-10-CM | POA: Diagnosis not present

## 2015-07-06 DIAGNOSIS — Z801 Family history of malignant neoplasm of trachea, bronchus and lung: Secondary | ICD-10-CM | POA: Diagnosis not present

## 2015-07-06 DIAGNOSIS — R319 Hematuria, unspecified: Secondary | ICD-10-CM | POA: Diagnosis not present

## 2015-07-06 DIAGNOSIS — Z79899 Other long term (current) drug therapy: Secondary | ICD-10-CM | POA: Diagnosis not present

## 2015-07-06 DIAGNOSIS — I252 Old myocardial infarction: Secondary | ICD-10-CM | POA: Diagnosis not present

## 2015-07-06 DIAGNOSIS — N3289 Other specified disorders of bladder: Secondary | ICD-10-CM | POA: Diagnosis not present

## 2015-07-10 DIAGNOSIS — C801 Malignant (primary) neoplasm, unspecified: Secondary | ICD-10-CM | POA: Diagnosis not present

## 2015-07-22 DIAGNOSIS — M170 Bilateral primary osteoarthritis of knee: Secondary | ICD-10-CM | POA: Diagnosis not present

## 2015-07-22 DIAGNOSIS — Z1389 Encounter for screening for other disorder: Secondary | ICD-10-CM | POA: Diagnosis not present

## 2015-07-22 DIAGNOSIS — Z0000 Encounter for general adult medical examination without abnormal findings: Secondary | ICD-10-CM | POA: Diagnosis not present

## 2015-07-22 DIAGNOSIS — R310 Gross hematuria: Secondary | ICD-10-CM | POA: Diagnosis not present

## 2015-09-12 DIAGNOSIS — C679 Malignant neoplasm of bladder, unspecified: Secondary | ICD-10-CM | POA: Diagnosis present

## 2015-09-12 DIAGNOSIS — Z801 Family history of malignant neoplasm of trachea, bronchus and lung: Secondary | ICD-10-CM | POA: Diagnosis not present

## 2015-09-12 DIAGNOSIS — I10 Essential (primary) hypertension: Secondary | ICD-10-CM | POA: Diagnosis not present

## 2015-09-12 DIAGNOSIS — R310 Gross hematuria: Secondary | ICD-10-CM | POA: Diagnosis not present

## 2015-09-12 DIAGNOSIS — D630 Anemia in neoplastic disease: Secondary | ICD-10-CM | POA: Diagnosis not present

## 2015-09-12 DIAGNOSIS — I2510 Atherosclerotic heart disease of native coronary artery without angina pectoris: Secondary | ICD-10-CM | POA: Diagnosis present

## 2015-09-12 DIAGNOSIS — Z79899 Other long term (current) drug therapy: Secondary | ICD-10-CM | POA: Diagnosis not present

## 2015-09-12 DIAGNOSIS — D62 Acute posthemorrhagic anemia: Secondary | ICD-10-CM | POA: Diagnosis not present

## 2015-09-12 DIAGNOSIS — C672 Malignant neoplasm of lateral wall of bladder: Secondary | ICD-10-CM | POA: Diagnosis not present

## 2015-09-12 DIAGNOSIS — M170 Bilateral primary osteoarthritis of knee: Secondary | ICD-10-CM | POA: Diagnosis present

## 2015-09-12 DIAGNOSIS — K449 Diaphragmatic hernia without obstruction or gangrene: Secondary | ICD-10-CM | POA: Diagnosis present

## 2015-09-12 DIAGNOSIS — Z880 Allergy status to penicillin: Secondary | ICD-10-CM | POA: Diagnosis not present

## 2015-09-12 DIAGNOSIS — Z888 Allergy status to other drugs, medicaments and biological substances status: Secondary | ICD-10-CM | POA: Diagnosis not present

## 2015-09-19 DIAGNOSIS — R310 Gross hematuria: Secondary | ICD-10-CM | POA: Diagnosis not present

## 2015-09-19 DIAGNOSIS — D6489 Other specified anemias: Secondary | ICD-10-CM | POA: Diagnosis not present

## 2015-10-12 DIAGNOSIS — Z79899 Other long term (current) drug therapy: Secondary | ICD-10-CM | POA: Diagnosis not present

## 2015-10-12 DIAGNOSIS — Z9981 Dependence on supplemental oxygen: Secondary | ICD-10-CM | POA: Diagnosis not present

## 2015-10-12 DIAGNOSIS — I252 Old myocardial infarction: Secondary | ICD-10-CM | POA: Diagnosis not present

## 2015-10-12 DIAGNOSIS — Z8546 Personal history of malignant neoplasm of prostate: Secondary | ICD-10-CM | POA: Diagnosis not present

## 2015-10-12 DIAGNOSIS — C679 Malignant neoplasm of bladder, unspecified: Secondary | ICD-10-CM | POA: Diagnosis not present

## 2015-10-12 DIAGNOSIS — R319 Hematuria, unspecified: Secondary | ICD-10-CM | POA: Diagnosis not present

## 2015-10-12 DIAGNOSIS — J449 Chronic obstructive pulmonary disease, unspecified: Secondary | ICD-10-CM | POA: Diagnosis not present

## 2015-11-18 DIAGNOSIS — I10 Essential (primary) hypertension: Secondary | ICD-10-CM | POA: Diagnosis not present

## 2015-11-18 DIAGNOSIS — R310 Gross hematuria: Secondary | ICD-10-CM | POA: Diagnosis not present

## 2015-11-18 DIAGNOSIS — D6489 Other specified anemias: Secondary | ICD-10-CM | POA: Diagnosis not present

## 2015-12-15 DIAGNOSIS — C675 Malignant neoplasm of bladder neck: Secondary | ICD-10-CM | POA: Diagnosis not present

## 2015-12-15 DIAGNOSIS — R310 Gross hematuria: Secondary | ICD-10-CM | POA: Diagnosis not present

## 2015-12-15 DIAGNOSIS — C673 Malignant neoplasm of anterior wall of bladder: Secondary | ICD-10-CM | POA: Diagnosis not present

## 2015-12-15 DIAGNOSIS — J449 Chronic obstructive pulmonary disease, unspecified: Secondary | ICD-10-CM | POA: Diagnosis not present

## 2015-12-15 DIAGNOSIS — R319 Hematuria, unspecified: Secondary | ICD-10-CM | POA: Diagnosis not present

## 2015-12-15 DIAGNOSIS — K449 Diaphragmatic hernia without obstruction or gangrene: Secondary | ICD-10-CM | POA: Diagnosis present

## 2015-12-15 DIAGNOSIS — M1990 Unspecified osteoarthritis, unspecified site: Secondary | ICD-10-CM | POA: Diagnosis present

## 2015-12-15 DIAGNOSIS — I2510 Atherosclerotic heart disease of native coronary artery without angina pectoris: Secondary | ICD-10-CM | POA: Diagnosis present

## 2015-12-15 DIAGNOSIS — D62 Acute posthemorrhagic anemia: Secondary | ICD-10-CM | POA: Diagnosis not present

## 2015-12-15 DIAGNOSIS — N3289 Other specified disorders of bladder: Secondary | ICD-10-CM | POA: Diagnosis not present

## 2015-12-15 DIAGNOSIS — I10 Essential (primary) hypertension: Secondary | ICD-10-CM | POA: Diagnosis not present

## 2015-12-15 DIAGNOSIS — C674 Malignant neoplasm of posterior wall of bladder: Secondary | ICD-10-CM | POA: Diagnosis not present

## 2015-12-15 DIAGNOSIS — D494 Neoplasm of unspecified behavior of bladder: Secondary | ICD-10-CM | POA: Diagnosis not present

## 2015-12-15 DIAGNOSIS — I25810 Atherosclerosis of coronary artery bypass graft(s) without angina pectoris: Secondary | ICD-10-CM | POA: Diagnosis not present

## 2015-12-15 DIAGNOSIS — R339 Retention of urine, unspecified: Secondary | ICD-10-CM | POA: Diagnosis not present

## 2015-12-15 DIAGNOSIS — C679 Malignant neoplasm of bladder, unspecified: Secondary | ICD-10-CM | POA: Diagnosis not present

## 2015-12-16 DIAGNOSIS — D494 Neoplasm of unspecified behavior of bladder: Secondary | ICD-10-CM | POA: Diagnosis not present

## 2016-02-19 DIAGNOSIS — I10 Essential (primary) hypertension: Secondary | ICD-10-CM | POA: Diagnosis not present

## 2016-02-19 DIAGNOSIS — K210 Gastro-esophageal reflux disease with esophagitis: Secondary | ICD-10-CM | POA: Diagnosis not present

## 2016-02-19 DIAGNOSIS — R310 Gross hematuria: Secondary | ICD-10-CM | POA: Diagnosis not present

## 2016-02-19 DIAGNOSIS — D6489 Other specified anemias: Secondary | ICD-10-CM | POA: Diagnosis not present

## 2016-03-14 DIAGNOSIS — J441 Chronic obstructive pulmonary disease with (acute) exacerbation: Secondary | ICD-10-CM | POA: Diagnosis not present

## 2016-03-14 DIAGNOSIS — R112 Nausea with vomiting, unspecified: Secondary | ICD-10-CM | POA: Diagnosis not present

## 2016-03-14 DIAGNOSIS — J849 Interstitial pulmonary disease, unspecified: Secondary | ICD-10-CM | POA: Diagnosis not present

## 2016-03-14 DIAGNOSIS — I2510 Atherosclerotic heart disease of native coronary artery without angina pectoris: Secondary | ICD-10-CM | POA: Diagnosis not present

## 2016-03-14 DIAGNOSIS — D62 Acute posthemorrhagic anemia: Secondary | ICD-10-CM | POA: Diagnosis not present

## 2016-03-14 DIAGNOSIS — K449 Diaphragmatic hernia without obstruction or gangrene: Secondary | ICD-10-CM | POA: Diagnosis not present

## 2016-03-14 DIAGNOSIS — R194 Change in bowel habit: Secondary | ICD-10-CM | POA: Diagnosis not present

## 2016-03-14 DIAGNOSIS — D649 Anemia, unspecified: Secondary | ICD-10-CM | POA: Diagnosis not present

## 2016-03-14 DIAGNOSIS — Z886 Allergy status to analgesic agent status: Secondary | ICD-10-CM | POA: Diagnosis not present

## 2016-03-14 DIAGNOSIS — R310 Gross hematuria: Secondary | ICD-10-CM | POA: Diagnosis not present

## 2016-03-14 DIAGNOSIS — Z79899 Other long term (current) drug therapy: Secondary | ICD-10-CM | POA: Diagnosis not present

## 2016-03-14 DIAGNOSIS — K5900 Constipation, unspecified: Secondary | ICD-10-CM | POA: Diagnosis not present

## 2016-03-14 DIAGNOSIS — R600 Localized edema: Secondary | ICD-10-CM | POA: Diagnosis not present

## 2016-03-14 DIAGNOSIS — J449 Chronic obstructive pulmonary disease, unspecified: Secondary | ICD-10-CM | POA: Diagnosis not present

## 2016-03-14 DIAGNOSIS — C679 Malignant neoplasm of bladder, unspecified: Secondary | ICD-10-CM | POA: Diagnosis not present

## 2016-03-14 DIAGNOSIS — N3289 Other specified disorders of bladder: Secondary | ICD-10-CM | POA: Diagnosis not present

## 2016-03-14 DIAGNOSIS — N390 Urinary tract infection, site not specified: Secondary | ICD-10-CM | POA: Diagnosis not present

## 2016-03-14 DIAGNOSIS — I10 Essential (primary) hypertension: Secondary | ICD-10-CM | POA: Diagnosis not present

## 2016-03-14 DIAGNOSIS — C678 Malignant neoplasm of overlapping sites of bladder: Secondary | ICD-10-CM | POA: Diagnosis not present

## 2016-03-14 DIAGNOSIS — R1030 Lower abdominal pain, unspecified: Secondary | ICD-10-CM | POA: Diagnosis not present

## 2016-03-14 DIAGNOSIS — I700 Atherosclerosis of aorta: Secondary | ICD-10-CM | POA: Diagnosis not present

## 2016-03-14 DIAGNOSIS — M1990 Unspecified osteoarthritis, unspecified site: Secondary | ICD-10-CM | POA: Diagnosis not present

## 2016-03-14 DIAGNOSIS — Z792 Long term (current) use of antibiotics: Secondary | ICD-10-CM | POA: Diagnosis not present

## 2016-03-15 DIAGNOSIS — C678 Malignant neoplasm of overlapping sites of bladder: Secondary | ICD-10-CM | POA: Diagnosis not present

## 2016-03-15 DIAGNOSIS — D62 Acute posthemorrhagic anemia: Secondary | ICD-10-CM | POA: Diagnosis not present

## 2016-03-15 DIAGNOSIS — C679 Malignant neoplasm of bladder, unspecified: Secondary | ICD-10-CM | POA: Diagnosis not present

## 2016-03-15 DIAGNOSIS — I700 Atherosclerosis of aorta: Secondary | ICD-10-CM | POA: Diagnosis not present

## 2016-03-15 DIAGNOSIS — J441 Chronic obstructive pulmonary disease with (acute) exacerbation: Secondary | ICD-10-CM | POA: Diagnosis not present

## 2016-03-15 DIAGNOSIS — N390 Urinary tract infection, site not specified: Secondary | ICD-10-CM | POA: Diagnosis not present

## 2016-03-15 DIAGNOSIS — R310 Gross hematuria: Secondary | ICD-10-CM | POA: Diagnosis not present

## 2016-03-16 DIAGNOSIS — R310 Gross hematuria: Secondary | ICD-10-CM | POA: Diagnosis not present

## 2016-03-16 DIAGNOSIS — J441 Chronic obstructive pulmonary disease with (acute) exacerbation: Secondary | ICD-10-CM | POA: Diagnosis not present

## 2016-03-16 DIAGNOSIS — D62 Acute posthemorrhagic anemia: Secondary | ICD-10-CM | POA: Diagnosis not present

## 2016-03-16 DIAGNOSIS — I700 Atherosclerosis of aorta: Secondary | ICD-10-CM | POA: Diagnosis not present

## 2016-03-16 DIAGNOSIS — N390 Urinary tract infection, site not specified: Secondary | ICD-10-CM | POA: Diagnosis not present

## 2016-03-16 DIAGNOSIS — C678 Malignant neoplasm of overlapping sites of bladder: Secondary | ICD-10-CM | POA: Diagnosis not present

## 2016-03-26 DIAGNOSIS — D6489 Other specified anemias: Secondary | ICD-10-CM | POA: Diagnosis not present

## 2016-03-26 DIAGNOSIS — R310 Gross hematuria: Secondary | ICD-10-CM | POA: Diagnosis not present

## 2016-04-12 DIAGNOSIS — Z23 Encounter for immunization: Secondary | ICD-10-CM | POA: Diagnosis not present

## 2016-04-12 DIAGNOSIS — I10 Essential (primary) hypertension: Secondary | ICD-10-CM | POA: Diagnosis not present

## 2016-04-12 DIAGNOSIS — J441 Chronic obstructive pulmonary disease with (acute) exacerbation: Secondary | ICD-10-CM | POA: Diagnosis not present

## 2016-04-12 DIAGNOSIS — R310 Gross hematuria: Secondary | ICD-10-CM | POA: Diagnosis not present

## 2016-04-12 DIAGNOSIS — K449 Diaphragmatic hernia without obstruction or gangrene: Secondary | ICD-10-CM | POA: Diagnosis not present

## 2016-04-12 DIAGNOSIS — C678 Malignant neoplasm of overlapping sites of bladder: Secondary | ICD-10-CM | POA: Diagnosis not present

## 2016-04-12 DIAGNOSIS — D649 Anemia, unspecified: Secondary | ICD-10-CM | POA: Diagnosis not present

## 2016-04-12 DIAGNOSIS — I2510 Atherosclerotic heart disease of native coronary artery without angina pectoris: Secondary | ICD-10-CM | POA: Diagnosis not present

## 2016-04-12 DIAGNOSIS — D6489 Other specified anemias: Secondary | ICD-10-CM | POA: Diagnosis not present

## 2016-04-12 DIAGNOSIS — M1990 Unspecified osteoarthritis, unspecified site: Secondary | ICD-10-CM | POA: Diagnosis not present

## 2016-04-12 DIAGNOSIS — J449 Chronic obstructive pulmonary disease, unspecified: Secondary | ICD-10-CM | POA: Diagnosis not present

## 2016-04-13 DIAGNOSIS — J449 Chronic obstructive pulmonary disease, unspecified: Secondary | ICD-10-CM | POA: Diagnosis not present

## 2016-04-13 DIAGNOSIS — M1990 Unspecified osteoarthritis, unspecified site: Secondary | ICD-10-CM | POA: Diagnosis not present

## 2016-04-13 DIAGNOSIS — I10 Essential (primary) hypertension: Secondary | ICD-10-CM | POA: Diagnosis not present

## 2016-04-13 DIAGNOSIS — I2510 Atherosclerotic heart disease of native coronary artery without angina pectoris: Secondary | ICD-10-CM | POA: Diagnosis not present

## 2016-04-13 DIAGNOSIS — R319 Hematuria, unspecified: Secondary | ICD-10-CM | POA: Diagnosis not present

## 2016-04-13 DIAGNOSIS — J441 Chronic obstructive pulmonary disease with (acute) exacerbation: Secondary | ICD-10-CM | POA: Diagnosis not present

## 2016-04-13 DIAGNOSIS — C678 Malignant neoplasm of overlapping sites of bladder: Secondary | ICD-10-CM | POA: Diagnosis not present

## 2016-04-13 DIAGNOSIS — K449 Diaphragmatic hernia without obstruction or gangrene: Secondary | ICD-10-CM | POA: Diagnosis not present

## 2016-04-13 DIAGNOSIS — D6489 Other specified anemias: Secondary | ICD-10-CM | POA: Diagnosis not present

## 2016-04-13 DIAGNOSIS — R310 Gross hematuria: Secondary | ICD-10-CM | POA: Diagnosis not present

## 2016-04-13 DIAGNOSIS — C679 Malignant neoplasm of bladder, unspecified: Secondary | ICD-10-CM | POA: Diagnosis not present

## 2016-04-13 DIAGNOSIS — D649 Anemia, unspecified: Secondary | ICD-10-CM | POA: Diagnosis not present

## 2016-04-14 DIAGNOSIS — C678 Malignant neoplasm of overlapping sites of bladder: Secondary | ICD-10-CM | POA: Diagnosis not present

## 2016-04-14 DIAGNOSIS — D649 Anemia, unspecified: Secondary | ICD-10-CM | POA: Diagnosis not present

## 2016-04-14 DIAGNOSIS — C679 Malignant neoplasm of bladder, unspecified: Secondary | ICD-10-CM | POA: Diagnosis not present

## 2016-04-14 DIAGNOSIS — R310 Gross hematuria: Secondary | ICD-10-CM | POA: Diagnosis not present

## 2016-04-14 DIAGNOSIS — R319 Hematuria, unspecified: Secondary | ICD-10-CM | POA: Diagnosis not present

## 2016-04-14 DIAGNOSIS — M1990 Unspecified osteoarthritis, unspecified site: Secondary | ICD-10-CM | POA: Diagnosis not present

## 2016-04-14 DIAGNOSIS — I2510 Atherosclerotic heart disease of native coronary artery without angina pectoris: Secondary | ICD-10-CM | POA: Diagnosis not present

## 2016-04-14 DIAGNOSIS — J449 Chronic obstructive pulmonary disease, unspecified: Secondary | ICD-10-CM | POA: Diagnosis not present

## 2016-04-14 DIAGNOSIS — K449 Diaphragmatic hernia without obstruction or gangrene: Secondary | ICD-10-CM | POA: Diagnosis not present

## 2016-04-14 DIAGNOSIS — I10 Essential (primary) hypertension: Secondary | ICD-10-CM | POA: Diagnosis not present

## 2016-04-15 DIAGNOSIS — I2510 Atherosclerotic heart disease of native coronary artery without angina pectoris: Secondary | ICD-10-CM | POA: Diagnosis not present

## 2016-04-15 DIAGNOSIS — D649 Anemia, unspecified: Secondary | ICD-10-CM | POA: Diagnosis not present

## 2016-04-15 DIAGNOSIS — C678 Malignant neoplasm of overlapping sites of bladder: Secondary | ICD-10-CM | POA: Diagnosis not present

## 2016-04-15 DIAGNOSIS — Z510 Encounter for antineoplastic radiation therapy: Secondary | ICD-10-CM | POA: Diagnosis not present

## 2016-04-15 DIAGNOSIS — J449 Chronic obstructive pulmonary disease, unspecified: Secondary | ICD-10-CM | POA: Diagnosis not present

## 2016-04-15 DIAGNOSIS — R310 Gross hematuria: Secondary | ICD-10-CM | POA: Diagnosis not present

## 2016-04-15 DIAGNOSIS — I10 Essential (primary) hypertension: Secondary | ICD-10-CM | POA: Diagnosis not present

## 2016-04-15 DIAGNOSIS — D6489 Other specified anemias: Secondary | ICD-10-CM | POA: Diagnosis not present

## 2016-04-15 DIAGNOSIS — J441 Chronic obstructive pulmonary disease with (acute) exacerbation: Secondary | ICD-10-CM | POA: Diagnosis not present

## 2016-04-19 DIAGNOSIS — C678 Malignant neoplasm of overlapping sites of bladder: Secondary | ICD-10-CM | POA: Diagnosis not present

## 2016-04-19 DIAGNOSIS — Z510 Encounter for antineoplastic radiation therapy: Secondary | ICD-10-CM | POA: Diagnosis not present

## 2016-04-20 DIAGNOSIS — Z510 Encounter for antineoplastic radiation therapy: Secondary | ICD-10-CM | POA: Diagnosis not present

## 2016-04-20 DIAGNOSIS — C678 Malignant neoplasm of overlapping sites of bladder: Secondary | ICD-10-CM | POA: Diagnosis not present

## 2016-04-21 DIAGNOSIS — Z510 Encounter for antineoplastic radiation therapy: Secondary | ICD-10-CM | POA: Diagnosis not present

## 2016-04-21 DIAGNOSIS — C678 Malignant neoplasm of overlapping sites of bladder: Secondary | ICD-10-CM | POA: Diagnosis not present

## 2016-04-22 DIAGNOSIS — Z510 Encounter for antineoplastic radiation therapy: Secondary | ICD-10-CM | POA: Diagnosis not present

## 2016-04-22 DIAGNOSIS — C678 Malignant neoplasm of overlapping sites of bladder: Secondary | ICD-10-CM | POA: Diagnosis not present

## 2016-04-23 DIAGNOSIS — Z510 Encounter for antineoplastic radiation therapy: Secondary | ICD-10-CM | POA: Diagnosis not present

## 2016-04-23 DIAGNOSIS — C678 Malignant neoplasm of overlapping sites of bladder: Secondary | ICD-10-CM | POA: Diagnosis not present

## 2016-04-26 DIAGNOSIS — C678 Malignant neoplasm of overlapping sites of bladder: Secondary | ICD-10-CM | POA: Diagnosis not present

## 2016-04-26 DIAGNOSIS — Z510 Encounter for antineoplastic radiation therapy: Secondary | ICD-10-CM | POA: Diagnosis not present

## 2016-04-27 DIAGNOSIS — C678 Malignant neoplasm of overlapping sites of bladder: Secondary | ICD-10-CM | POA: Diagnosis not present

## 2016-04-27 DIAGNOSIS — Z510 Encounter for antineoplastic radiation therapy: Secondary | ICD-10-CM | POA: Diagnosis not present

## 2016-04-28 DIAGNOSIS — C678 Malignant neoplasm of overlapping sites of bladder: Secondary | ICD-10-CM | POA: Diagnosis not present

## 2016-04-28 DIAGNOSIS — Z510 Encounter for antineoplastic radiation therapy: Secondary | ICD-10-CM | POA: Diagnosis not present

## 2016-04-29 DIAGNOSIS — C678 Malignant neoplasm of overlapping sites of bladder: Secondary | ICD-10-CM | POA: Diagnosis not present

## 2016-04-29 DIAGNOSIS — Z510 Encounter for antineoplastic radiation therapy: Secondary | ICD-10-CM | POA: Diagnosis not present

## 2016-04-30 DIAGNOSIS — C678 Malignant neoplasm of overlapping sites of bladder: Secondary | ICD-10-CM | POA: Diagnosis not present

## 2016-04-30 DIAGNOSIS — Z510 Encounter for antineoplastic radiation therapy: Secondary | ICD-10-CM | POA: Diagnosis not present

## 2016-06-02 DIAGNOSIS — C678 Malignant neoplasm of overlapping sites of bladder: Secondary | ICD-10-CM | POA: Diagnosis not present

## 2016-06-02 DIAGNOSIS — Z923 Personal history of irradiation: Secondary | ICD-10-CM | POA: Diagnosis not present

## 2016-06-15 ENCOUNTER — Encounter (HOSPITAL_COMMUNITY): Payer: Medicare Other | Attending: Hematology & Oncology | Admitting: Adult Health

## 2016-06-15 ENCOUNTER — Encounter (HOSPITAL_COMMUNITY): Payer: Self-pay | Admitting: Adult Health

## 2016-06-15 VITALS — BP 128/57 | HR 73 | Temp 98.1°F | Resp 18 | Ht 70.0 in | Wt 260.6 lb

## 2016-06-15 DIAGNOSIS — D649 Anemia, unspecified: Secondary | ICD-10-CM | POA: Insufficient documentation

## 2016-06-15 DIAGNOSIS — Z7189 Other specified counseling: Secondary | ICD-10-CM

## 2016-06-15 DIAGNOSIS — C679 Malignant neoplasm of bladder, unspecified: Secondary | ICD-10-CM | POA: Insufficient documentation

## 2016-06-15 LAB — CBC WITH DIFFERENTIAL/PLATELET
BASOS ABS: 0 10*3/uL (ref 0.0–0.1)
Basophils Relative: 1 %
Eosinophils Absolute: 0.1 10*3/uL (ref 0.0–0.7)
Eosinophils Relative: 2 %
HEMATOCRIT: 33.6 % — AB (ref 39.0–52.0)
Hemoglobin: 9.9 g/dL — ABNORMAL LOW (ref 13.0–17.0)
LYMPHS PCT: 15 %
Lymphs Abs: 0.9 10*3/uL (ref 0.7–4.0)
MCH: 25.4 pg — ABNORMAL LOW (ref 26.0–34.0)
MCHC: 29.5 g/dL — ABNORMAL LOW (ref 30.0–36.0)
MCV: 86.4 fL (ref 78.0–100.0)
Monocytes Absolute: 0.6 10*3/uL (ref 0.1–1.0)
Monocytes Relative: 9 %
NEUTROS ABS: 4.5 10*3/uL (ref 1.7–7.7)
Neutrophils Relative %: 73 %
Platelets: 226 10*3/uL (ref 150–400)
RBC: 3.89 MIL/uL — ABNORMAL LOW (ref 4.22–5.81)
RDW: 16.7 % — ABNORMAL HIGH (ref 11.5–15.5)
WBC: 6.1 10*3/uL (ref 4.0–10.5)

## 2016-06-15 LAB — IRON AND TIBC
Iron: 33 ug/dL — ABNORMAL LOW (ref 45–182)
Saturation Ratios: 7 % — ABNORMAL LOW (ref 17.9–39.5)
TIBC: 480 ug/dL — AB (ref 250–450)
UIBC: 447 ug/dL

## 2016-06-15 LAB — FOLATE: Folate: 13.1 ng/mL (ref >5.9)

## 2016-06-15 LAB — VITAMIN B12: VITAMIN B 12: 799 pg/mL (ref 180–914)

## 2016-06-15 LAB — FERRITIN: Ferritin: 10 ng/mL — ABNORMAL LOW (ref 24–336)

## 2016-06-15 NOTE — Patient Instructions (Addendum)
[place] at [facility] Discharge Instructions  RECOMMENDATIONS MADE BY THE CONSULTANT AND ANY TEST RESULTS WILL BE SENT TO YOUR REFERRING PHYSICIAN.  You saw Mike Craze, NP and Dr.Penland today. Follow up in 3 weeks. See Amy at checkout for appointments.  Thank you for choosing [place] at [facility] to provide your oncology and hematology care.  To afford each patient quality time with our provider, please arrive at least 15 minutes before your scheduled appointment time.   Beginning January 23rd 2017 lab work for the [facility] will be done in the  Main lab at [facility] on 1st floor. If you have a lab appointment with the [place] [facility] please come in thru the  Main Entrance and check in at the main information desk  You need to re-schedule your appointment should you arrive 10 or more minutes late.  We strive to give you quality time with our providers, and arriving late affects you and other patients whose appointments are after yours.  Also, if you no show three or more times for appointments you may be dismissed from the clinic at the providers discretion.     Again, thank you for choosing [facility].  Our hope is that these requests will decrease the amount of time that you wait before being seen by our physicians.       _____________________________________________________________  Should you have questions after your visit to [facility], please contact our office at [phone] between the hours of 8:30 a.m. and 4:30 p.m.  Voicemails left after 4:30 p.m. will not be returned until the following business day.  For prescription refill requests, have your pharmacy contact our office.         Resources For Cancer Patients and their Caregivers ? American Cancer Society: Can assist with transportation, wigs, general needs, runs Look Good Feel Better.        (352)433-0481 ? Cancer  Care: Provides financial assistance, online support groups, medication/co-pay assistance.  1-800-813-HOPE 432-380-2755) ? [place] Assists [place] cancer patients and their families through emotional , educational and financial support.  760 228 0674 ? [place] DSS Where to apply for food stamps, Medicaid and utility assistance. 725 728 3938 ? [facility]: Transportation to medical appointments. 726-245-6319 ? Social Security Administration: May apply for disability if have a Stage IV cancer. 4358600740 737-563-8558 ? [employer], Disability and Transit Services: Assists with nutrition, care and transit needs. [place] Support Programs: @10RELATIVEDAYS @ > Cancer Support Group  2nd Tuesday of the month 1pm-2pm, Journey Room  > Creative Journey  3rd Tuesday of the month 1130am-1pm, Journey Room  > Look Good Feel Better  1st Wednesday of the month 10am-12 noon, Journey Room (Call [place] to register [phone])

## 2016-06-15 NOTE — Progress Notes (Signed)
[place], [place] [address]   CLINIC:  Medical Oncology  REASON FOR CONSULTATION:  Locally-advanced unresectable bladder cancer   REFERRAL FROM:  Dr. Vanice Sarah & Dr. Tyler Pita  [phone]  PRIMARY CARE PROVIDER: Dr. Stoney Bang  [phone]   HISTORY OF PRESENT ILLNESS:  Mr. Hearst is a 80 y.o. gentleman who presents with his friend (neighbor) for consultation for locally-advanced unresectable bladder cancer.  Of note, HPI info gathered from available outside records as well as the patient's recollection of his treatment history.    Mr. Vogt tells me his initial diagnosis of bladder cancer dates back about 12 years ago.  He has undergone several TURBT procedures with Dr. Exie Parody, his urologist. He also had several fulguration procedures and mitomycin C treatments for his bladder cancer.   About 2 years ago, he reports his disease worsened with frequent bouts of frank hematuria.  He also reportedly has a history of early stage prostate cancer (Gleason 2 [1+1] disease per Dr. Johny Shears note) in 2006, which was found incidentally on TURP procedure and monitored with PSA tests alone per patient. He has many comorbid conditions, which have been updated in his medical record today.    More recently, he was having severe hematuria requiring intermittent PRBC transfusions. He was also struggling with urinary incontinence as well.  He recently completed palliative radiation therapy (completed 30 Gy in 3 fractions); completed radiation on 04/30/16. His hematuria has completed resolved.  He has also beginning to regain some control of his bladder at this time as well.    When asked if he has ever been told the extent or stage of his disease, he tells me, "I don't think anybody has ever told me any of that. I don't know if I have cancer anywhere else in my body or not."  He does report having intermittent right lower quadrant abdominal/pelvic  pain; this has been going on for several weeks.  He is concerned that his cancer has spread to this area.    It is also important to note, that Mr. Vanderweide had radiation therapy for meningioma (right orbital aex tumor) in 2010; he reportedly has had no recurrence since that time.    Mr. Shanafelt tells me he is not interested in "too aggressive" treatment going forward.  He states, "I think my cancer might be pretty far down the line and I don't any anything too aggressive.  I just don't know what my options are, but I know I don't want to feel bad."    He and his friend go on to tell me that he has a DNR at his home and has always been a DNR when he goes into the hospital.    REVIEW OF SYSTEMS: Review of Systems  Constitutional: Positive for malaise/fatigue.  HENT: Negative.   Eyes: Negative.   Respiratory: Positive for shortness of breath (h/o COPD; oxygen dependent).   Cardiovascular: Positive for leg swelling. Negative for chest pain.  Gastrointestinal: Positive for abdominal pain (RLQ abdominal/pelvic pain ).  Genitourinary: Negative for hematuria.  Skin: Negative.   Neurological: Negative.   Endo/Heme/Allergies: Negative.       PAST MEDICAL & SURGICAL HISTORY:  Past Medical History:  Diagnosis Date  . Anemia   . Arthritis   . Asthma   . Bladder cancer ([place])   . Cerebral meningioma ([place])   . COPD (chronic obstructive pulmonary disease) ([place])   . Coronary atherosclerosis of native coronary artery  DES to OM October 2014  . Essential hypertension, benign   . GERD (gastroesophageal reflux disease)   . Hiatal hernia   . History of asbestosis   . History of pneumonia   . Hypertension   . Mixed hyperlipidemia   . Nephrolithiasis   . Prostate cancer ([place])   . Sleep apnea    Unable to tolerate CPAP    Past Surgical History:  Procedure Laterality Date  . CATARACT EXTRACTION W/ INTRAOCULAR LENS  IMPLANT, BILATERAL Bilateral 2013  . CRANIOTOMY FOR STEREOTACTIC GUIDED  SURGERY  07/2010   Meningioma resection  . KIDNEY STONE SURGERY  2012   "opened me up" (04/06/2013)  . KNEE SURGERY Right   . LEFT HEART CATHETERIZATION WITH CORONARY ANGIOGRAM N/A 04/06/2013   Procedure: LEFT HEART CATHETERIZATION WITH CORONARY ANGIOGRAM;  Surgeon: Blane Ohara, MD;  Location: [facility] CATH LAB;  Service: Cardiovascular;  Laterality: N/A;  . PERCUTANEOUS CORONARY STENT INTERVENTION (PCI-S)  04/06/2013   Procedure: PERCUTANEOUS CORONARY STENT INTERVENTION (PCI-S);  Surgeon: Blane Ohara, MD;  Location: [facility] CATH LAB;  Service: Cardiovascular;;  . PROSTATECTOMY  2010     ONCOLOGIC FAMILY HISTORY:  -Mother: Died with colon cancer -Father: Leukemia, lymphoma, and skin cancer -Twin brother: Lung cancer -Sister: Died with breast cancer    SOCIAL HISTORY:  Mr. Bargman is widowed and lives alone; his wife died from pancreatic cancer is 2012. They were married for 25 years.  He was born and raised in [place]. [place] and loved riding horses.  His neighbor helps him, when needed.  He has 3 sons and 2 stepchildren.  He is retired; he previously worked as the Archivist for [employer]; he also served in [employer]. He denies any current tobacco use; he previously smoked 4 ppd x ~35 years. No alcohol or illicit drug use.     CURRENT MEDICATIONS:  Current Outpatient Prescriptions on File Prior to Visit  Medication Sig Dispense Refill  . albuterol (PROVENTIL HFA;VENTOLIN HFA) 108 (90 BASE) MCG/ACT inhaler Inhale 2 puffs into the lungs every 6 (six) hours as needed for wheezing. 1 Inhaler 2  . amLODipine (NORVASC) 5 MG tablet Take 1 tablet (5 mg total) by mouth daily. 90 tablet 3  . aspirin EC 81 MG EC tablet Take 1 tablet (81 mg total) by mouth daily. 30 tablet 0  . Diclofenac-Misoprostol (ARTHROTEC) 75-0.2 MG TBEC Take 1 tablet by mouth 2 (two) times daily.    . fish oil-omega-3 fatty acids 1000 MG capsule Take 1 g by mouth daily.     . formoterol (FORADIL) 12  MCG capsule for inhaler Place 12 mcg into inhaler and inhale 2 (two) times daily.    Marland Kitchen gabapentin (NEURONTIN) 300 MG capsule Take 300 mg by mouth 3 (three) times daily.    . isosorbide mononitrate (IMDUR) 30 MG 24 hr tablet Take 1 tablet (30 mg total) by mouth 2 (two) times daily. 180 tablet 3  . LORazepam (ATIVAN) 0.5 MG tablet Take 0.5 mg by mouth daily.     . metoprolol (LOPRESSOR) 50 MG tablet Take 1 tablet (50 mg total) by mouth 2 (two) times daily. 60 tablet 6  . nitroGLYCERIN (NITROSTAT) 0.4 MG SL tablet Place 1 tablet (0.4 mg total) under the tongue every 5 (five) minutes as needed for chest pain. 25 tablet 3  . omeprazole (PRILOSEC) 40 MG capsule Take 40 mg by mouth daily.    Marland Kitchen oxazepam (SERAX) 10 MG capsule Take 1  capsule by mouth at bedtime.    . pravastatin (PRAVACHOL) 40 MG tablet TAKE 1 TABLET EVERY EVENING 90 tablet 3  . SEREVENT DISKUS 50 MCG/DOSE diskus inhaler Inhale 1 puff into the lungs 2 (two) times daily.    Marland Kitchen tiotropium (SPIRIVA) 18 MCG inhalation capsule Place 18 mcg into inhaler and inhale daily.     No current facility-administered medications on file prior to visit.      ALLERGIES: Allergies  Allergen Reactions  . Lidocaine   . Penicillins      PERFORMANCE STATUS: 60   PHYSICAL EXAM:  Vitals:   06/15/16 1458  BP: (!) 128/57  Pulse: 73  Resp: 18  Temp: 98.1 F (36.7 C)   Filed Weights   06/15/16 1458  Weight: 260 lb 9.6 oz (118.2 kg)    General: Pale male in no acute distress.  Accompanied by friend/neighbor.   HEENT: Head is normocephalic.  Pupils equal and reactive to light. Conjunctivae clear without exudate.  Sclerae anicteric. Oral mucosa is pink and moist without lesions. Oropharynx is pink and moist without lesions. Lymph: No cervical, supraclavicular, or infraclavicular lymphadenopathy noted on palpation.   Cardiovascular: Normal rate and rhythm Respiratory: Mild expiratory wheezes bilaterally. Chest expansion symmetric without accessory  muscle use. Breathing non-labored. Barrel chest noted.    GU: Deferred.   GI: Soft abdomen. Normoactive bowel sounds. Mild tenderness to palpation to right lower quadrant.  Neuro: No focal deficits. Steady gait.   Psych: Normal mood and affect for situation.  Tearful at times during visit.  Extremities: Trace LE edema.  Skin: Warm and dry.    LABORATORY DATA:    DIAGNOSTIC IMAGING:  Unclear if patient has had recent CT imaging; will contact [facility] to see if he has had staging scans.     ASSESSMENT & PLAN:  Mr. Degrand is a pleasant 80 y.o. male with history of reportedly locally advanced unresectable bladder cancer treated with TURBT, mitomycin C, and palliative XRT; completed radiation on 04/30/16. He presents to cancer center today to establish care with medical oncology and to discuss further plan of care for his bladder cancer.     1. Unresectable locally advanced bladder cancer: We will contact [facility] to see if Mr. Demery has had recent restaging CT scans done within the past 2-3 months.  If he has not, then we will order CT chest, abdomen, & pelvis for restaging.  This will help [place] better understand the extent of his disease, at which time we can better explain his possible treatment options.  If he has metastatic disease, then he may be eligible for immune therapy, which is often better tolerated than traditional chemotherapy.  He agrees to undergo restaging scans (if he has not had recent scans done).  Symptomatically, he has no hematuria at this time and is regaining bladder control.  He does have intermittent right lower quadrant abdominal/pelvic pain, which could possibly be secondary to recent radiation therapy and healing/inflammation.  We will know more after he has restaging scans.  He will return to the cancer center in about 3 weeks to review the results of his scans and finalize his treatment plan at that time.   2. Anemia: Given his history of anemia due to  hematuria and intermittent need for transfusions, we will collect labs today to further evaluate his anemia.  We will collect iron studies, ferritin, and vitamin B12 and call the patient with results when they are available.   3. Goals of care discussion:  We spent some time today reviewing Mr. Rewerts goals of care.  He remains steadfast that he does not wish to have aggressive therapy and wants optimal control of his symptoms, but agrees that he would like to know his options before making a final decision, which is completely reasonable.  Should his heart stop beating or he stop breathing, he does not wish to have CPR or intubation.  His friend is aware of his wishes, as well as his children.  He tells me he does have a living will stating his wishes; he is not sure if his son has a copy or not.  I recommended that Mr. Dority get a copy of his living will to his son so that all are informed of his wishes.  He becomes tearful at times when discussing his goals of care and states, "I'm not crying because I am scared to die. It just makes me think about my wife being sick and her dying, and that makes me very sad again."  I provided support today with active listening, validation of his concerns, and expressive supportive counseling.  I let him know that we would not prescribe any treatments that he was not comfortable doing; he voices appreciation.  DNR orders placed and signed today.      Dispo:  -Labs today to check for anemia given h/o significant blood loss d/t hematuria.  -Restaging CT chest/abd/pelvis sometime within the next 2 weeks, if patient has not had scans done at [facility] within the past 2-3 months.   -Return to cancer center to see Forestine Na to see Dr. Whitney Muse in about 3 weeks.     -This patient was seen & evaluated by Dr. Ancil Linsey, who formulated the above plan of care. This was a shared visit consultation.  Please see her attestation documentation for additional  details.    A total of 60 minutes was spent in face-to-face care of this patient, with greater than 50% of that time spent in counseling and care coordination.    Mike Craze, NP [place] [phone]   As Above. This is a shared visit with Mike Craze NP. HPI is as detailed. Plan is to restage patient and the rationale for this was explained to the patient. If he does not have clear evidence of metastatic disease on imaging at this point I would recommend observation. If he does, he would be a reasonable candidate for immunotherapy, he is not a candidate for platinum based therapy given his PS.   General: Pale male in no acute distress.  Accompanied by friend/neighbor.   HEENT: Head is normocephalic.  Pupils equal and reactive to light. Conjunctivae clear without exudate.  Sclerae anicteric. Oral mucosa is pink and moist without lesions. Oropharynx is pink and moist without lesions. Lymph: No cervical, supraclavicular, or infraclavicular lymphadenopathy noted on palpation.   Cardiovascular: Normal rate and rhythm Respiratory: Mild expiratory wheezes bilaterally. Chest expansion symmetric without accessory muscle use. Breathing non-labored. Barrel chest noted.    GU: Deferred.   GI: Soft abdomen. Normoactive bowel sounds. Mild tenderness to palpation to right lower quadrant.  Neuro: No focal deficits. Steady gait.   Psych: Normal mood and affect for situation.  Tearful at times during visit.  Extremities: Trace LE edema.  Skin: Warm and dry.   Treatment plan will be discussed further once imaging complete. I will see him back post. Goals of care were discussed as noted above.  Molli Hazard, MD

## 2016-06-16 ENCOUNTER — Encounter (HOSPITAL_COMMUNITY): Payer: Self-pay | Admitting: Adult Health

## 2016-06-16 ENCOUNTER — Telehealth (HOSPITAL_COMMUNITY): Payer: Self-pay | Admitting: Hematology & Oncology

## 2016-06-16 NOTE — Pre-Procedure Instructions (Signed)
Labs OSH

## 2016-06-17 ENCOUNTER — Ambulatory Visit (HOSPITAL_COMMUNITY): Payer: Medicare Other | Admitting: Hematology & Oncology

## 2016-06-22 ENCOUNTER — Other Ambulatory Visit (HOSPITAL_COMMUNITY): Payer: Self-pay | Admitting: Hematology & Oncology

## 2016-06-22 DIAGNOSIS — D500 Iron deficiency anemia secondary to blood loss (chronic): Secondary | ICD-10-CM | POA: Insufficient documentation

## 2016-06-24 ENCOUNTER — Encounter (HOSPITAL_COMMUNITY): Payer: Self-pay

## 2016-06-24 ENCOUNTER — Encounter (HOSPITAL_BASED_OUTPATIENT_CLINIC_OR_DEPARTMENT_OTHER): Payer: Medicare Other

## 2016-06-24 VITALS — BP 143/67 | HR 65 | Temp 98.1°F | Resp 18

## 2016-06-24 DIAGNOSIS — D500 Iron deficiency anemia secondary to blood loss (chronic): Secondary | ICD-10-CM | POA: Diagnosis present

## 2016-06-24 DIAGNOSIS — R319 Hematuria, unspecified: Secondary | ICD-10-CM

## 2016-06-24 MED ORDER — SODIUM CHLORIDE 0.9 % IV SOLN
INTRAVENOUS | Status: DC
  Administered 2016-06-24: 14:00:00 via INTRAVENOUS

## 2016-06-24 MED ORDER — SODIUM CHLORIDE 0.9 % IV SOLN
750.0000 mg | Freq: Once | INTRAVENOUS | Status: AC
  Administered 2016-06-24: 750 mg via INTRAVENOUS
  Filled 2016-06-24: qty 15

## 2016-06-24 NOTE — Patient Instructions (Signed)
[facility] at [facility] Discharge Instructions  RECOMMENDATIONS MADE BY THE CONSULTANT AND ANY TEST RESULTS WILL BE SENT TO YOUR REFERRING PHYSICIAN.  Iron infusion today. Return as scheduled for lab work and office visit.   Thank you for choosing [facility] at [facility] to provide your oncology and hematology care.  To afford each patient quality time with our provider, please arrive at least 15 minutes before your scheduled appointment time.    If you have a lab appointment with the Cancer Center please come in thru the  Main Entrance and check in at the main information desk  You need to re-schedule your appointment should you arrive 10 or more minutes late.  We strive to give you quality time with our providers, and arriving late affects you and other patients whose appointments are after yours.  Also, if you no show three or more times for appointments you may be dismissed from the clinic at the providers discretion.     Again, thank you for choosing [facility].  Our hope is that these requests will decrease the amount of time that you wait before being seen by our physicians.       _____________________________________________________________  Should you have questions after your visit to [facility], please contact our office at [phone] between the hours of 8:30 a.m. and 4:30 p.m.  Voicemails left after 4:30 p.m. will not be returned until the following business day.  For prescription refill requests, have your pharmacy contact our office.       Resources For Cancer Patients and their Caregivers ? American Cancer Society: Can assist with transportation, wigs, general needs, runs Look Good Feel Better.        1-888-227-6333 ? Cancer Care: Provides financial assistance, online support groups, medication/co-pay assistance.  1-800-813-HOPE (4673) ? [facility] Assists  [place] cancer patients and their families through emotional , educational and financial support.  336-427-4357 ? [place] DSS Where to apply for food stamps, Medicaid and utility assistance. 336-342-1394 ? RCATS: Transportation to medical appointments. 336-347-2287 ? Social Security Administration: May apply for disability if have a Stage IV cancer. 336-342-7796 1-800-772-1213 ? [place] Aging, Disability and Transit Services: Assists with nutrition, care and transit needs. 336-349-2343  Cancer Center Support Programs: @10RELATIVEDAYS@ > Cancer Support Group  2nd Tuesday of the month 1pm-2pm, Journey Room  > Creative Journey  3rd Tuesday of the month 1130am-1pm, Journey Room  > Look Good Feel Better  1st Wednesday of the month 10am-12 noon, Journey Room (Call American Cancer [employer] to register 1-800-395-5775)   

## 2016-06-24 NOTE — Progress Notes (Signed)
Tolerated infusion w/o adverse reaction.  Alert, in no distress. VSS.  Discharged via wheelchair. 

## 2016-06-25 ENCOUNTER — Ambulatory Visit (HOSPITAL_COMMUNITY): Payer: Medicare Other

## 2016-06-29 ENCOUNTER — Telehealth: Payer: Self-pay | Admitting: Cardiology

## 2016-06-29 NOTE — Telephone Encounter (Signed)
Numerous attempts to contact patient with recall letters. Unable to reach by telephone. with no success.   Chanda Busing [id number] 05/21/2015 1:11 PM New [10]    [System] 07/30/2015 11:03 PM Notification Sent [20]   Weston Anna [id number] 12/09/2015 9:28 AM Notification Sent [20]   Chanda Busing [id number] 02/03/2016 2:44 PM Notification Sent [20]   Lynnda Child Slaughter [id number] 04/20/2016 1:34 PM Notification Sent [20]   Weston Anna [id number] 06/15/2016 2:25 PM Notification Sent [20]

## 2016-06-30 ENCOUNTER — Ambulatory Visit (HOSPITAL_COMMUNITY): Admission: RE | Admit: 2016-06-30 | Payer: Medicare Other | Source: Ambulatory Visit

## 2016-06-30 ENCOUNTER — Telehealth (HOSPITAL_COMMUNITY): Payer: Self-pay | Admitting: *Deleted

## 2016-06-30 NOTE — Telephone Encounter (Signed)
Message left on patient's phone letting him know that Dr. Whitney Muse was aware that he didn't do the CT scan d/t not being able to drink either contrast.

## 2016-07-01 DIAGNOSIS — J441 Chronic obstructive pulmonary disease with (acute) exacerbation: Secondary | ICD-10-CM | POA: Diagnosis not present

## 2016-07-01 DIAGNOSIS — J4521 Mild intermittent asthma with (acute) exacerbation: Secondary | ICD-10-CM | POA: Diagnosis not present

## 2016-07-01 DIAGNOSIS — K219 Gastro-esophageal reflux disease without esophagitis: Secondary | ICD-10-CM | POA: Diagnosis not present

## 2016-07-01 DIAGNOSIS — I10 Essential (primary) hypertension: Secondary | ICD-10-CM | POA: Diagnosis not present

## 2016-07-01 DIAGNOSIS — C678 Malignant neoplasm of overlapping sites of bladder: Secondary | ICD-10-CM | POA: Diagnosis not present

## 2016-07-01 DIAGNOSIS — D6489 Other specified anemias: Secondary | ICD-10-CM | POA: Diagnosis not present

## 2016-07-01 DIAGNOSIS — M1711 Unilateral primary osteoarthritis, right knee: Secondary | ICD-10-CM | POA: Diagnosis not present

## 2016-07-01 DIAGNOSIS — I2510 Atherosclerotic heart disease of native coronary artery without angina pectoris: Secondary | ICD-10-CM | POA: Diagnosis not present

## 2016-07-06 ENCOUNTER — Encounter: Payer: Self-pay | Admitting: *Deleted

## 2016-07-06 NOTE — Progress Notes (Signed)
[place] Psychosocial Distress Screening Clinical Social Work  Clinical Social Work was referred by distress screening protocol.  The patient scored a 5 on the Psychosocial Distress Thermometer which indicates moderate distress. Clinical Social Worker reviewed chart and phoned pt to assess for distress and other psychosocial needs. CSW introduced self and explained role of CSW. Pt shared he was "told by doctor to not get CT scan, they can call [facility]". Pt plans to share physical concerns with MD later this week. Pt denied other needs, reported to have transportation and agrees to reach out as needed.   ONCBCN DISTRESS SCREENING 06/15/2016  Screening Type Initial Screening  Distress experienced in past week (1-10) 5  Physical Problem type Pain;Sleep/insomnia;Getting around;Breathing;Changes in urination;Tingling hands/feet;Skin dry/itchy;Swollen arms/legs  Physician notified of physical symptoms Yes  Referral to clinical psychology No  Referral to clinical social work Yes  Referral to dietition No  Referral to financial advocate No  Referral to support programs No  Referral to palliative care No    Clinical Social Worker follow up needed: No.  If yes, follow up plan:  Loren Racer, [place] Tuesdays   [phone]

## 2016-07-08 ENCOUNTER — Other Ambulatory Visit (HOSPITAL_COMMUNITY): Payer: Self-pay | Admitting: *Deleted

## 2016-07-08 DIAGNOSIS — D500 Iron deficiency anemia secondary to blood loss (chronic): Secondary | ICD-10-CM

## 2016-07-09 ENCOUNTER — Encounter (HOSPITAL_COMMUNITY): Payer: Medicare Other

## 2016-07-09 ENCOUNTER — Encounter (HOSPITAL_COMMUNITY): Payer: Self-pay | Admitting: Hematology & Oncology

## 2016-07-09 ENCOUNTER — Encounter (HOSPITAL_COMMUNITY): Payer: Medicare Other | Attending: Hematology & Oncology | Admitting: Hematology & Oncology

## 2016-07-09 VITALS — BP 138/71 | HR 69 | Temp 98.3°F | Resp 20 | Wt 254.5 lb

## 2016-07-09 DIAGNOSIS — N189 Chronic kidney disease, unspecified: Secondary | ICD-10-CM | POA: Diagnosis not present

## 2016-07-09 DIAGNOSIS — I10 Essential (primary) hypertension: Secondary | ICD-10-CM | POA: Insufficient documentation

## 2016-07-09 DIAGNOSIS — K449 Diaphragmatic hernia without obstruction or gangrene: Secondary | ICD-10-CM | POA: Diagnosis not present

## 2016-07-09 DIAGNOSIS — E782 Mixed hyperlipidemia: Secondary | ICD-10-CM | POA: Insufficient documentation

## 2016-07-09 DIAGNOSIS — Z87891 Personal history of nicotine dependence: Secondary | ICD-10-CM | POA: Diagnosis not present

## 2016-07-09 DIAGNOSIS — D500 Iron deficiency anemia secondary to blood loss (chronic): Secondary | ICD-10-CM

## 2016-07-09 DIAGNOSIS — K219 Gastro-esophageal reflux disease without esophagitis: Secondary | ICD-10-CM | POA: Diagnosis not present

## 2016-07-09 DIAGNOSIS — I2510 Atherosclerotic heart disease of native coronary artery without angina pectoris: Secondary | ICD-10-CM | POA: Insufficient documentation

## 2016-07-09 DIAGNOSIS — G4730 Sleep apnea, unspecified: Secondary | ICD-10-CM | POA: Insufficient documentation

## 2016-07-09 DIAGNOSIS — J449 Chronic obstructive pulmonary disease, unspecified: Secondary | ICD-10-CM | POA: Diagnosis not present

## 2016-07-09 DIAGNOSIS — Z8546 Personal history of malignant neoplasm of prostate: Secondary | ICD-10-CM | POA: Insufficient documentation

## 2016-07-09 DIAGNOSIS — R319 Hematuria, unspecified: Secondary | ICD-10-CM | POA: Insufficient documentation

## 2016-07-09 DIAGNOSIS — D509 Iron deficiency anemia, unspecified: Secondary | ICD-10-CM

## 2016-07-09 DIAGNOSIS — Z7189 Other specified counseling: Secondary | ICD-10-CM

## 2016-07-09 DIAGNOSIS — J45909 Unspecified asthma, uncomplicated: Secondary | ICD-10-CM | POA: Insufficient documentation

## 2016-07-09 DIAGNOSIS — C679 Malignant neoplasm of bladder, unspecified: Secondary | ICD-10-CM | POA: Diagnosis not present

## 2016-07-09 DIAGNOSIS — D649 Anemia, unspecified: Secondary | ICD-10-CM | POA: Diagnosis not present

## 2016-07-09 LAB — CBC WITH DIFFERENTIAL/PLATELET
BASOS PCT: 1 %
Basophils Absolute: 0 10*3/uL (ref 0.0–0.1)
Eosinophils Absolute: 0.1 10*3/uL (ref 0.0–0.7)
Eosinophils Relative: 1 %
HEMATOCRIT: 36.8 % — AB (ref 39.0–52.0)
HEMOGLOBIN: 11.2 g/dL — AB (ref 13.0–17.0)
Lymphocytes Relative: 16 %
Lymphs Abs: 1.4 10*3/uL (ref 0.7–4.0)
MCH: 27 pg (ref 26.0–34.0)
MCHC: 30.4 g/dL (ref 30.0–36.0)
MCV: 88.7 fL (ref 78.0–100.0)
MONOS PCT: 5 %
Monocytes Absolute: 0.4 10*3/uL (ref 0.1–1.0)
NEUTROS ABS: 6.7 10*3/uL (ref 1.7–7.7)
NEUTROS PCT: 77 %
Platelets: 188 10*3/uL (ref 150–400)
RBC: 4.15 MIL/uL — AB (ref 4.22–5.81)
RDW: 18.9 % — ABNORMAL HIGH (ref 11.5–15.5)
WBC: 8.6 10*3/uL (ref 4.0–10.5)

## 2016-07-09 LAB — FERRITIN: FERRITIN: 197 ng/mL (ref 24–336)

## 2016-07-09 MED ORDER — OXYCODONE-ACETAMINOPHEN 5-325 MG PO TABS: 1.0000 | ORAL_TABLET | ORAL | 0 refills | Status: DC | PRN

## 2016-07-09 NOTE — Patient Instructions (Signed)
[place] at [facility] Discharge Instructions  RECOMMENDATIONS MADE BY THE CONSULTANT AND ANY TEST RESULTS WILL BE SENT TO YOUR REFERRING PHYSICIAN.  You were seen today by Dr. Whitney Muse PET scan ordered Follow up in clinic after PET scan  You were given constipation sheet for information on relieving your constipation  Thank you for choosing [place] at [facility] to provide your oncology and hematology care.  To afford each patient quality time with our provider, please arrive at least 15 minutes before your scheduled appointment time.    If you have a lab appointment with the [facility] please come in thru the  Main Entrance and check in at the main information desk  You need to re-schedule your appointment should you arrive 10 or more minutes late.  We strive to give you quality time with our providers, and arriving late affects you and other patients whose appointments are after yours.  Also, if you no show three or more times for appointments you may be dismissed from the clinic at the providers discretion.     Again, thank you for choosing [facility].  Our hope is that these requests will decrease the amount of time that you wait before being seen by our physicians.       _____________________________________________________________  Should you have questions after your visit to [facility] please contact our office at [phone] between the hours of 8:30 a.m. and 4:30 p.m.  Voicemails left after 4:30 p.m. will not be returned until the following business day.  For prescription refill requests, have your pharmacy contact our office.       Resources For Cancer Patients and their Caregivers ? American Cancer Society: Can assist with transportation, wigs, general needs, runs Look Good Feel Better.        317 214 6116 ? Cancer Care: Provides financial assistance, online support groups,  medication/co-pay assistance.  1-800-813-HOPE 905-009-9233) ? [facility] Assists [place] cancer patients and their families through emotional , educational and financial support.  540-850-9868 ? [place] DSS Where to apply for food stamps, Medicaid and utility assistance. 443-146-2052 ? RCATS: Transportation to medical appointments. 223-725-1925 ? Social Security Administration: May apply for disability if have a Stage IV cancer. 513-409-7451 519-015-3815 ? [employer], Disability and Transit Services: Assists with nutrition, care and transit needs. [place] Support Programs: @10RELATIVEDAYS @ > Cancer Support Group  2nd Tuesday of the month 1pm-2pm, Journey Room  > Creative Journey  3rd Tuesday of the month 1130am-1pm, Journey Room  > Look Good Feel Better  1st Wednesday of the month 10am-12 noon, Journey Room (Call [place] to register [phone])

## 2016-07-09 NOTE — Progress Notes (Signed)
PROGRESS NOTE  Harold Burly, MD [place] / [place] [id number]  DIAGNOSIS: Locally-advanced unresectable bladder cancer   CURRENT THERAPY: Observation  INTERVAL HISTORY: Harold York 81 y.o. male returns for a malignant neoplasm of urinary bladder. (Original consultation 06/25/2016)  Harold York presents to the cancer center today accompanied by his brother. Harold York presents in a wheelchair. I have reviewed the labs with the patient.   Harold York was supposed to get a CT of the abdomen, pelvis, and chest, but never went. Harold York says Harold York didn't want to drink the contrast since Harold York gets diarrhea after it. Harold York did not notify our office, Harold York just simply did not go..   Harold York hurts in his lower right abdomen. Harold York says Harold York can tell Harold York has cancer in other places. Harold York also has pain near his mouth.   Harold York says Harold York briefly passed some blood in his urine yesterday. Harold York notes that it was not a large amount.   Harold York had fluid drained from his knees last week so Harold York has been able to walk a little more. Harold York is getting a portable oxygen tank so that Harold York can leave the house more. Harold York is on 2L of O2 and wears it all of the time at home.   Harold York has been alternating between constipation and diarrhea. When Harold York has diarrhea, Harold York has it about once a day. Harold York has been eating well.   Harold York does not have any pain medications.   Harold York has more headaches. It feels like pressure behind his eyes.   MEDICAL HISTORY: Past Medical History:  Diagnosis Date  . Anemia   . Arthritis   . Asthma   . Bladder cancer ([place])   . Cerebral meningioma ([place])   . COPD (chronic obstructive pulmonary disease) ([place])   . Coronary atherosclerosis of native coronary artery    DES to OM October 2014  . Essential hypertension, benign   . GERD (gastroesophageal reflux disease)   . Hiatal hernia   . History of asbestosis   . History of pneumonia   . Hypertension   . Mixed hyperlipidemia   . Nephrolithiasis   . Prostate cancer ([place])   . Sleep apnea    Unable  to tolerate CPAP    SURGICAL HISTORY: Past Surgical History:  Procedure Laterality Date  . CATARACT EXTRACTION W/ INTRAOCULAR LENS  IMPLANT, BILATERAL Bilateral 2013  . CRANIOTOMY FOR STEREOTACTIC GUIDED SURGERY  07/2010   Meningioma resection  . KIDNEY STONE SURGERY  2012   "opened me up" (04/06/2013)  . KNEE SURGERY Right   . LEFT HEART CATHETERIZATION WITH CORONARY ANGIOGRAM N/A 04/06/2013   Procedure: LEFT HEART CATHETERIZATION WITH CORONARY ANGIOGRAM;  Surgeon: Blane Ohara, MD;  Location: [facility] CATH LAB;  Service: Cardiovascular;  Laterality: N/A;  . PERCUTANEOUS CORONARY STENT INTERVENTION (PCI-S)  04/06/2013   Procedure: PERCUTANEOUS CORONARY STENT INTERVENTION (PCI-S);  Surgeon: Blane Ohara, MD;  Location: [facility] CATH LAB;  Service: Cardiovascular;;  . PROSTATECTOMY  2010    SOCIAL HISTORY: Social History   Social History  . Marital status: Widowed    Spouse name: N/A  . Number of children: N/A  . Years of education: N/A   Occupational History  . Not on file.   Social History Main Topics  . Smoking status: Former Smoker    Packs/day: 4.00    Years: 35.00    Types: Cigarettes    Quit date: 06/29/1987  . Smokeless tobacco: Never Used  . Alcohol use No  .  Drug use: No  . Sexual activity: No   Other Topics Concern  . Not on file   Social History Narrative  . No narrative on file    FAMILY HISTORY: Family History  Problem Relation Age of Onset  . Heart disease Brother     Review of Systems  Constitutional: Negative.   HENT:       Pain near mouth  Eyes: Negative.   Respiratory: Negative.   Cardiovascular: Negative.   Gastrointestinal: Positive for abdominal pain (RLQ), constipation and diarrhea.  Genitourinary: Positive for hematuria.  Musculoskeletal: Negative.   Skin: Negative.   Neurological: Positive for headaches.  Endo/Heme/Allergies: Negative.   Psychiatric/Behavioral: Negative.   All other systems reviewed and are negative. 14 point  review of systems was performed and is negative except as detailed under history of present illness and above   PHYSICAL EXAMINATION  ECOG PERFORMANCE STATUS: 0 - Asymptomatic  Vitals:   07/09/16 0853 07/09/16 0904  BP: 138/71 138/71  Pulse: 69 69  Resp: 20 20  Temp: 98.3 F (36.8 C) 98.3 F (36.8 C)     Physical Exam  Constitutional: Harold York is oriented to person, place, and time and well-developed, well-nourished, and in no distress.  Exam done from wheelchair. Wears glasses.   HENT:  Head: Normocephalic and atraumatic.  Mouth/Throat: Oropharynx is clear and moist.  Eyes: Conjunctivae and EOM are normal. Pupils are equal, round, and reactive to light.  Neck: Normal range of motion. Neck supple.  Cardiovascular: Normal rate, regular rhythm and normal heart sounds.   Pulmonary/Chest: Effort normal. Harold York has wheezes.  Abdominal: Soft. Bowel sounds are normal. Harold York exhibits no distension and no mass. There is no tenderness. There is no rebound and no guarding.  Musculoskeletal: Normal range of motion. Harold York exhibits edema.  Neurological: Harold York is alert and oriented to person, place, and time. Gait normal.  Skin: Skin is warm and dry.  Nursing note and vitals reviewed.   LABORATORY DATA:  CBC    Component Value Date/Time   WBC 8.6 07/09/2016 0754   RBC 4.15 (L) 07/09/2016 0754   HGB 11.2 (L) 07/09/2016 0754   HCT 36.8 (L) 07/09/2016 0754   PLT 188 07/09/2016 0754   MCV 88.7 07/09/2016 0754   MCH 27.0 07/09/2016 0754   MCHC 30.4 07/09/2016 0754   RDW 18.9 (H) 07/09/2016 0754   LYMPHSABS 1.4 07/09/2016 0754   MONOABS 0.4 07/09/2016 0754   EOSABS 0.1 07/09/2016 0754   BASOSABS 0.0 07/09/2016 0754    CMP     Component Value Date/Time   NA 139 04/12/2013 1700   K 4.5 04/12/2013 1700   CL 102 04/12/2013 1700   CO2 30 04/12/2013 1700   GLUCOSE 94 04/12/2013 1700   BUN 26 (H) 04/12/2013 1700   CREATININE 1.37 (H) 04/12/2013 1700   CALCIUM 9.6 04/12/2013 1700   PROT 6.7  03/13/2013 0233   ALBUMIN 3.5 03/13/2013 0233   AST 33 03/13/2013 0233   ALT 54 (H) 03/13/2013 0233   ALKPHOS 81 03/13/2013 0233   BILITOT 0.5 03/13/2013 0233   GFRNONAA 49 (L) 04/12/2013 1700   GFRAA 57 (L) 04/12/2013 1700   Results for Harold, York (MRN [id number]) as of 07/29/2016 09:31  Ref. Range 06/15/2016 16:57  Iron Latest Ref Range: 45 - 182 ug/dL 33 (L)  UIBC Latest Units: ug/dL 447  TIBC Latest Ref Range: 250 - 450 ug/dL 480 (H)  Saturation Ratios Latest Ref Range: 17.9 - 39.5 % 7 (L)  Ferritin Latest Ref Range: 24 - 336 ng/mL 10 (L)  Folate Latest Ref Range: >5.9 ng/mL 13.1  Vitamin B12 Latest Ref Range: 180 - 914 pg/mL 799   ASSESSMENT and THERAPY PLAN:  Malignant neoplasm of urinary bladder Unresectable locally advanced bladder cancer anemia CKD Hematuria Goals of care discussion Iron deficiency anemia  Harold York received one dose of iron for his iron deficiency. Harold York did well. Will continue to monitor his H/H and ferritin moving forward.  I again addressed with the patient that I cannot offer him any therapy without additional staging. Harold York does not want to do CT imaging. I will try to order PET imaging. Harold York is agreeable to this.  Order PET scan at [facility] Will discuss treatment options based on these results.   Prescription for pain medication. Harold York has taken oxycodone before and did well with it.   I will get him a sheet on how to prevent constipation.   Harold York will return for a follow up after his PET scan.   Orders Placed This Encounter  Procedures  . NM PET Image Restag (PS) Skull Base To Thigh    Standing Status:   Future    Number of Occurrences:   1    Standing Expiration Date:   07/09/2017    Order Specific Question:   Reason for Exam (SYMPTOM  OR DIAGNOSIS REQUIRED)    Answer:   restaging advanced urothelial carcinoma    Order Specific Question:   Preferred imaging location?    Answer:   [facility]    Order Specific Question:   If indicated for  the ordered procedure, I authorize the administration of a radiopharmaceutical per Radiology protocol    Answer:   Yes  . CBC with Differential    Standing Status:   Future    Standing Expiration Date:   10/07/2016  . Comprehensive metabolic panel    Standing Status:   Future    Standing Expiration Date:   10/07/2016  . Ferritin    Standing Status:   Future    Standing Expiration Date:   10/07/2016    All questions were answered. The patient knows to call the clinic with any problems, questions or concerns. We can certainly see the patient much sooner if necessary.  This document serves as a record of services personally performed by Ancil Linsey, MD. It was created on her behalf by Martinique Casey, a trained medical scribe. The creation of this record is based on the scribe's personal observations and the provider's statements to them. This document has been checked and approved by the attending provider.  I have reviewed the above documentation for accuracy and completeness and I agree with the above.  This note was electronically signed. Molli Hazard, MD  07/09/2016

## 2016-07-23 ENCOUNTER — Ambulatory Visit (HOSPITAL_COMMUNITY)
Admission: RE | Admit: 2016-07-23 | Discharge: 2016-07-23 | Disposition: A | Discharge status: Home / Self Care | Payer: Medicare Other | Source: Ambulatory Visit | Attending: Hematology & Oncology | Admitting: Hematology & Oncology

## 2016-07-23 DIAGNOSIS — R918 Other nonspecific abnormal finding of lung field: Secondary | ICD-10-CM | POA: Diagnosis not present

## 2016-07-23 DIAGNOSIS — C679 Malignant neoplasm of bladder, unspecified: Secondary | ICD-10-CM | POA: Diagnosis not present

## 2016-07-23 LAB — GLUCOSE, CAPILLARY: GLUCOSE-CAPILLARY: 145 mg/dL — AB (ref 65–99)

## 2016-07-23 MED ORDER — FLUDEOXYGLUCOSE F - 18 (FDG) INJECTION
12.5200 | Freq: Once | INTRAVENOUS | Status: AC | PRN
  Administered 2016-07-23: 12.52 via INTRAVENOUS

## 2016-07-27 ENCOUNTER — Encounter (HOSPITAL_COMMUNITY): Payer: Medicare Other

## 2016-07-27 ENCOUNTER — Other Ambulatory Visit (HOSPITAL_COMMUNITY): Payer: Self-pay | Admitting: Oncology

## 2016-07-27 ENCOUNTER — Encounter (HOSPITAL_BASED_OUTPATIENT_CLINIC_OR_DEPARTMENT_OTHER): Payer: Medicare Other | Admitting: Adult Health

## 2016-07-27 ENCOUNTER — Encounter (HOSPITAL_COMMUNITY): Payer: Self-pay | Admitting: Adult Health

## 2016-07-27 VITALS — BP 140/69 | HR 79 | Temp 98.6°F | Resp 18 | Wt 254.8 lb

## 2016-07-27 DIAGNOSIS — D500 Iron deficiency anemia secondary to blood loss (chronic): Secondary | ICD-10-CM

## 2016-07-27 DIAGNOSIS — R350 Frequency of micturition: Secondary | ICD-10-CM

## 2016-07-27 DIAGNOSIS — C679 Malignant neoplasm of bladder, unspecified: Secondary | ICD-10-CM

## 2016-07-27 DIAGNOSIS — I10 Essential (primary) hypertension: Secondary | ICD-10-CM | POA: Diagnosis not present

## 2016-07-27 DIAGNOSIS — D649 Anemia, unspecified: Secondary | ICD-10-CM

## 2016-07-27 DIAGNOSIS — J449 Chronic obstructive pulmonary disease, unspecified: Secondary | ICD-10-CM | POA: Diagnosis not present

## 2016-07-27 DIAGNOSIS — R3989 Other symptoms and signs involving the genitourinary system: Secondary | ICD-10-CM

## 2016-07-27 DIAGNOSIS — R319 Hematuria, unspecified: Secondary | ICD-10-CM | POA: Diagnosis not present

## 2016-07-27 DIAGNOSIS — R300 Dysuria: Secondary | ICD-10-CM

## 2016-07-27 DIAGNOSIS — I2510 Atherosclerotic heart disease of native coronary artery without angina pectoris: Secondary | ICD-10-CM | POA: Diagnosis not present

## 2016-07-27 DIAGNOSIS — J45909 Unspecified asthma, uncomplicated: Secondary | ICD-10-CM | POA: Diagnosis not present

## 2016-07-27 LAB — CBC WITH DIFFERENTIAL/PLATELET
Basophils Absolute: 0 10*3/uL (ref 0.0–0.1)
Basophils Relative: 0 %
Eosinophils Absolute: 0.1 10*3/uL (ref 0.0–0.7)
Eosinophils Relative: 1 %
HEMATOCRIT: 39.1 % (ref 39.0–52.0)
HEMOGLOBIN: 12.2 g/dL — AB (ref 13.0–17.0)
LYMPHS ABS: 1.2 10*3/uL (ref 0.7–4.0)
Lymphocytes Relative: 19 %
MCH: 28.2 pg (ref 26.0–34.0)
MCHC: 31.2 g/dL (ref 30.0–36.0)
MCV: 90.5 fL (ref 78.0–100.0)
MONOS PCT: 7 %
Monocytes Absolute: 0.4 10*3/uL (ref 0.1–1.0)
NEUTROS ABS: 4.6 10*3/uL (ref 1.7–7.7)
NEUTROS PCT: 73 %
Platelets: 181 10*3/uL (ref 150–400)
RBC: 4.32 MIL/uL (ref 4.22–5.81)
RDW: 19.3 % — ABNORMAL HIGH (ref 11.5–15.5)
WBC: 6.3 10*3/uL (ref 4.0–10.5)

## 2016-07-27 LAB — FERRITIN: Ferritin: 61 ng/mL (ref 24–336)

## 2016-07-27 NOTE — Progress Notes (Signed)
Patient asked to give [place] a urine specimen for a UA and C&S.  He states that he unable to void at this time and has "places to go and things to do", so he will get a specimen later and return it to [place].  NP aware. Patient educated on the importance of [place] performing this test on his urine.

## 2016-07-27 NOTE — Progress Notes (Signed)
[name], [name]   CLINIC:  Medical Oncology  REASON FOR FOLLOW-UP:  Locally-advanced unresectable bladder cancer & iron deficiency anemia   PRIMARY CARE PROVIDER: Dr. Stoney York  [phone]  CURRENT THERAPY: Observation  HISTORY OF PRESENT ILLNESS:  (From original consult note on 06/15/16) Harold York is a 81 y.o. gentleman who presents with his friend (neighbor) for consultation for locally-advanced unresectable bladder cancer.  Of note, HPI info gathered from available outside records as well as the patient's recollection of his treatment history.    Harold York tells me his initial diagnosis of bladder cancer dates back about 12 years ago.  He has undergone several TURBT procedures with Dr. Exie York, his urologist. He also had several fulguration procedures and mitomycin C treatments for his bladder cancer.   About 2 years ago, he reports his disease worsened with frequent bouts of frank hematuria.  He also reportedly has a history of early stage prostate cancer (Gleason 2 [1+1] disease per Dr. Johny York note) in 2006, which was found incidentally on TURP procedure and monitored with PSA tests alone per patient. He has many comorbid conditions, which have been updated in his medical record today.    More recently, he was having severe hematuria requiring intermittent PRBC transfusions. He was also struggling with urinary incontinence as well.  He recently completed palliative radiation therapy (completed 30 Gy in 3 fractions); completed radiation on 04/30/16. His hematuria has completed resolved.  He has also beginning to regain some control of his bladder at this time as well.    When asked if he has ever been told the extent or stage of his disease, he tells me, "I don't think anybody has ever told me any of that. I don't know if I have cancer anywhere else in my body or not."  He does report having intermittent right lower quadrant  abdominal/pelvic pain; this has been going on for several weeks.  He is concerned that his cancer has spread to this area.    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    Harold York tells me he is not interested in "too aggressive" treatment going forward.  He states, "I think my cancer might be pretty far down the line and I don't any anything too aggressive.  I just don't know what my options are, but I know I don't want to feel bad."    He and his friend go on to tell me that he has a DNR at his home and has always been a DNR when he goes into the hospital.    INTERVAL HISTORY:  Harold York presents today with his neighbor/friend for continued follow-up.  He had a restaging PET scan on 07/23/16 and is here to talk about those results and develop a plan of care.   He tells me his hematuria returned about 2 weeks ago. He he has noted bleeding during his whole stream but states "I can't tell for sure." He endorses some burning and urinary frequency, about 10-12 times/day. Denies passing any blood clots. His urine is red and clear per his report. He does have intermittent bladder pain, for which he takes Harold York about once per day, usually only at bedtime. The friend/neighbor who is with him today, tells me that he thinks that Harold York needs a refill on  the Harold York.  He continues to have weakness and fatigue. His appetite is okay by his report. He does not sleep well, which has been an issue for the past 2 weeks. He does report taking several naps throughout the day.    REVIEW OF SYSTEMS: Review of Systems  Constitutional: Positive for malaise/fatigue.  HENT: Negative.   Eyes: Negative.   Respiratory: Positive for shortness of breath (h/o COPD; oxygen dependent).   Cardiovascular: Positive for leg swelling. Negative for chest pain.  Gastrointestinal: Positive  for abdominal pain (RLQ abdominal/pelvic pain ).  Genitourinary: Negative for hematuria.  Skin: Negative.   Neurological: Negative.   Endo/Heme/Allergies: Negative.      PAST MEDICAL & SURGICAL HISTORY:  Past Medical History:  Diagnosis Date  . Anemia   . Arthritis   . Asthma   . Bladder cancer ([place])   . Cerebral meningioma ([place])   . COPD (chronic obstructive pulmonary disease) ([place])   . Coronary atherosclerosis of native coronary artery    DES to OM October 2014  . Essential hypertension, benign   . GERD (gastroesophageal reflux disease)   . Hiatal hernia   . History of asbestosis   . History of pneumonia   . Hypertension   . Mixed hyperlipidemia   . Nephrolithiasis   . Prostate cancer ([place])   . Sleep apnea    Unable to tolerate CPAP    Past Surgical History:  Procedure Laterality Date  . CATARACT EXTRACTION W/ INTRAOCULAR LENS  IMPLANT, BILATERAL Bilateral 2013  . CRANIOTOMY FOR STEREOTACTIC GUIDED SURGERY  07/2010   Meningioma resection  . KIDNEY STONE SURGERY  2012   "opened me up" (04/06/2013)  . KNEE SURGERY Right   . LEFT HEART CATHETERIZATION WITH CORONARY ANGIOGRAM N/A 04/06/2013   Procedure: LEFT HEART CATHETERIZATION WITH CORONARY ANGIOGRAM;  Surgeon: Blane Ohara, MD;  Location: [facility] CATH LAB;  Service: Cardiovascular;  Laterality: N/A;  . PERCUTANEOUS CORONARY STENT INTERVENTION (PCI-S)  04/06/2013   Procedure: PERCUTANEOUS CORONARY STENT INTERVENTION (PCI-S);  Surgeon: Blane Ohara, MD;  Location: [facility] CATH LAB;  Service: Cardiovascular;;  . PROSTATECTOMY  2010     ONCOLOGIC FAMILY HISTORY:  -Mother: Died with colon cancer -Father: Leukemia, lymphoma, and skin cancer -Twin brother: Lung cancer -Sister: Died with breast cancer    SOCIAL HISTORY:  Harold York is widowed and lives alone; his wife died from pancreatic cancer is 2012. They were married for 25 years.  He was born and raised in [place]. [place] and loved riding horses.  His  neighbor helps him, when needed.  He has 3 sons and 2 stepchildren.  He is retired; he previously worked as the Archivist for [employer]; he also served in [employer]. He denies any current tobacco use; he previously smoked 4 ppd x ~35 years. No alcohol or illicit drug use.     CURRENT MEDICATIONS:  Current Outpatient Prescriptions on File Prior to Visit  Medication Sig Dispense Refill  . albuterol (PROVENTIL HFA;VENTOLIN HFA) 108 (90 BASE) MCG/ACT inhaler Inhale 2 puffs into the lungs every 6 (six) hours as needed for wheezing. 1 Inhaler 2  . amLODipine (NORVASC) 5 MG tablet Take 1 tablet (5 mg total) by mouth daily. 90 tablet 3  . aspirin EC 81 MG EC tablet Take 1 tablet (81 mg total) by mouth daily. 30 tablet 0  . Diclofenac-Misoprostol (ARTHROTEC) 75-0.2 MG TBEC Take 1 tablet by mouth 2 (two) times daily.    Marland Kitchen  fish oil-omega-3 fatty acids 1000 MG capsule Take 1 g by mouth daily.     . formoterol (FORADIL) 12 MCG capsule for inhaler Place 12 mcg into inhaler and inhale 2 (two) times daily.    Marland Kitchen gabapentin (NEURONTIN) 300 MG capsule Take 300 mg by mouth 3 (three) times daily.    . isosorbide mononitrate (IMDUR) 30 MG 24 hr tablet Take 1 tablet (30 mg total) by mouth 2 (two) times daily. 180 tablet 3  . LORazepam (ATIVAN) 0.5 MG tablet Take 0.5 mg by mouth daily.     . metoprolol (LOPRESSOR) 50 MG tablet Take 1 tablet (50 mg total) by mouth 2 (two) times daily. 60 tablet 6  . nitroGLYCERIN (NITROSTAT) 0.4 MG SL tablet Place 1 tablet (0.4 mg total) under the tongue every 5 (five) minutes as needed for chest pain. 25 tablet 3  . omeprazole (PRILOSEC) 40 MG capsule Take 40 mg by mouth daily.    Marland Kitchen oxazepam (SERAX) 10 MG capsule Take 1 capsule by mouth at bedtime.    Marland Kitchen oxybutynin (DITROPAN-XL) 10 MG 24 hr tablet     . oxyCODONE-acetaminophen (Harold York/ROXICET) 5-325 MG tablet Take 1 tablet by mouth every 4 (four) hours as needed for severe pain. 60 tablet 0  . pravastatin (PRAVACHOL) 40  MG tablet TAKE 1 TABLET EVERY EVENING 90 tablet 3  . SEREVENT DISKUS 50 MCG/DOSE diskus inhaler Inhale 1 puff into the lungs 2 (two) times daily.    Marland Kitchen tiotropium (SPIRIVA) 18 MCG inhalation capsule Place 18 mcg into inhaler and inhale daily.     No current facility-administered medications on file prior to visit.      ALLERGIES: Allergies  Allergen Reactions  . Lidocaine   . Penicillins      PERFORMANCE STATUS: 60   PHYSICAL EXAM:  Vitals:   07/27/16 0907  BP: 140/69  Pulse: 79  Resp: 18  Temp: 98.6 F (37 C)   Filed Weights   07/27/16 0907  Weight: 254 lb 12.8 oz (115.6 kg)    General: Pale male in no acute distress.  Accompanied by friend/neighbor.   HEENT: Head is normocephalic.  Pupils equal and reactive to light. Conjunctivae clear without exudate.  Sclerae anicteric. Oral mucosa is pink and moist without lesions. Oropharynx is pink and moist without lesions. Lymph: No cervical, supraclavicular, or infraclavicular lymphadenopathy noted on palpation.   Cardiovascular: Normal rate and rhythm Respiratory: Mild expiratory wheezes bilaterally. Chest expansion symmetric without accessory muscle use. Breathing non-labored. Barrel chest noted.    GU: Deferred.   GI: Soft, non-tender abdomen. Normoactive bowel sounds.  Neuro: No focal deficits. Gait not assessed; patient seen seated in wheelchair.    Psych: Normal mood and affect for situation.   Extremities: Trace LE edema.  Skin: Warm and dry.    LABORATORY DATA:    DIAGNOSTIC IMAGING:  PET scan: 07/23/16    ASSESSMENT & PLAN:  Harold York is a pleasant 81 y.o. male with history of reportedly locally advanced unresectable bladder cancer treated with TURBT, mitomycin C, and palliative XRT; completed radiation on 04/30/16. He presents to cancer center today for follow-up with medical oncology and to discuss further plan of care for his bladder cancer.     Unresectable locally advanced bladder cancer:  -His most  recent PET scan on 07/23/16 was negative for metastatic disease. He does have small indeterminate pulmonary nodules, which are likely benign per radiologist's report; follow-up with CT chest in 3-4 months was recommended. I discussed these recommendations with  the patient. I shared that these nodules are unlikely to be metastatic disease, but we will follow them clinically and with serial imaging as appropriate. Regarding future plan of care for his locally advanced bladder cancer, there is likely no systemic therapy available to him at this time. Certainly if he develops metastatic disease then immunotherapy or chemotherapy could be potential options. However, given his overall marginal performance status, chemotherapy would likely not be our first choice. With regards to continued management, I discussed his case with Dr. Whitney Muse. She recommended that I speak with Dr. Alice Rieger in [place], who is the GU expert there. I discussed Harold York case with Dr. Alen Blew; he recommends referral back to urology for consideration of surgical interventions possibly cystectomy versus repeat fulguration. Whether his bladder cancer is superficial versus obvious muscle invasion will likely impact the surgical and urologic procedure options for Harold York.   Our hope is that continued interventions with urology will optimize his symptoms of hematuria and bladder pain. Harold York has been clear that his goals of care are palliation of his symptoms; he does not desire treatments that may be too invasive or diminish his quality of life.  Therefore, from a medical oncology standpoint, we will continue to monitor and may offer systemic therapy if he develops metastatic disease. We will also continue to manage his anemia likely secondary to chronic blood loss. Conversations with Dr. Alen Blew occurred after the patient left clinic today; I will give him a call and discuss our recommendations for him going forward which include  referral back to urology for consideration of future treatment. He will return to the cancer center in 2 months for continued follow-up.  Anemia:  -His hemoglobin is slightly improved today, despite recent report of 2 weeks of hematuria. Hemoglobin 12.1 today. He is slightly symptomatic with continued fatigue and weakness. His ferritin and iron studies were not available for review while the patient was on the clinic;  result available now showing ferritin at 61 ng/dL. His calculated iron deficit is 446. Therefore, given his continued hematuria, we will make arrangements for the patient to come in for one dose of IV injector for 750 mg, for a target hemoglobin of 14 g/dL. We will bring him back in approximately 8 weeks to reassess his iron deficiency anemia post-IV iron infusion.   Pelvic pain:  -Maintain current pain treatment regimen with Harold York 5/325 mg as needed. Upon review of the patient's most recent prescriptions, he received a prescription from Dr. Whitney Muse on 07/09/16 and receives 60 tablets. Since the patient is only requiring once daily dosing of Harold York, he should have plenty of medication left at this time. Encouraged him to call our office when he does need a refill or if his pain worsens and he requires increased pain management.  Goals of care:  -We reviewed his goals of care which include palliation of his symptoms and improving quality of life.  He does not desire treatments with significant symptom burden.  He remains a DNR and his friends/family are aware of his wishes.    Dispo:  -Return to cancer center in 2 months for continued follow-up with CBC with diff & ferritin.       A total of 30 minutes was spent in face-to-face care of this patient, with greater than 50% of that time spent in counseling and care coordination.    Mike Craze, NP [place] [phone]

## 2016-07-27 NOTE — Patient Instructions (Addendum)
[place] at [facility] Discharge Instructions  RECOMMENDATIONS MADE BY THE CONSULTANT AND ANY TEST RESULTS WILL BE SENT TO YOUR REFERRING PHYSICIAN.  Exam with Mike Craze, NP. We will call you with return appointment information.   We are going to test your urine today for bacteria and will call you with the results.     Thank you for choosing [place] at [facility] to provide your oncology and hematology care.  To afford each patient quality time with our provider, please arrive at least 15 minutes before your scheduled appointment time.    If you have a lab appointment with the [facility] please come in thru the  Main Entrance and check in at the main information desk  You need to re-schedule your appointment should you arrive 10 or more minutes late.  We strive to give you quality time with our providers, and arriving late affects you and other patients whose appointments are after yours.  Also, if you no show three or more times for appointments you may be dismissed from the clinic at the providers discretion.     Again, thank you for choosing [facility].  Our hope is that these requests will decrease the amount of time that you wait before being seen by our physicians.       _____________________________________________________________  Should you have questions after your visit to [facility], please contact our office at [phone] between the hours of 8:30 a.m. and 4:30 p.m.  Voicemails left after 4:30 p.m. will not be returned until the following business day.  For prescription refill requests, have your pharmacy contact our office.       Resources For Cancer Patients and their Caregivers ? American Cancer Society: Can assist with transportation, wigs, general needs, runs Look Good Feel Better.        404-646-3436 ? Cancer Care: Provides financial assistance, online support groups,  medication/co-pay assistance.  1-800-813-HOPE (915) 709-5763) ? [place] Assists [place] cancer patients and their families through emotional , educational and financial support.  (252)554-7136 ? [place] DSS Where to apply for food stamps, Medicaid and utility assistance. 5398822930 ? [facility]: Transportation to medical appointments. (706)290-4202 ? Social Security Administration: May apply for disability if have a Stage IV cancer. (270)041-7768 (504)802-5209 ? [employer], Disability and Transit Services: Assists with nutrition, care and transit needs. [place] Support Programs: @10RELATIVEDAYS @ > Cancer Support Group  2nd Tuesday of the month 1pm-2pm, Journey Room  > Creative Journey  3rd Tuesday of the month 1130am-1pm, Journey Room  > Look Good Feel Better  1st Wednesday of the month 10am-12 noon, Journey Room (Call [place] to register [phone])

## 2016-07-28 ENCOUNTER — Encounter (HOSPITAL_COMMUNITY): Payer: Self-pay | Admitting: Adult Health

## 2016-07-29 ENCOUNTER — Encounter (HOSPITAL_COMMUNITY): Payer: Self-pay | Admitting: Hematology & Oncology

## 2016-08-02 ENCOUNTER — Telehealth (HOSPITAL_COMMUNITY): Payer: Self-pay | Admitting: *Deleted

## 2016-08-02 NOTE — Telephone Encounter (Signed)
Pt aware that Dr. Whitney Muse and Dr. Alen Blew recommended that he go back to his urologist to see if there was anything they could. Pt verbalized understanding.

## 2016-08-04 ENCOUNTER — Ambulatory Visit (HOSPITAL_COMMUNITY): Payer: Medicare Other

## 2016-08-06 ENCOUNTER — Other Ambulatory Visit (HOSPITAL_COMMUNITY): Payer: Self-pay | Admitting: Hematology & Oncology

## 2016-08-06 ENCOUNTER — Telehealth (HOSPITAL_COMMUNITY): Payer: Self-pay

## 2016-08-06 DIAGNOSIS — Z955 Presence of coronary angioplasty implant and graft: Secondary | ICD-10-CM | POA: Diagnosis not present

## 2016-08-06 DIAGNOSIS — Z880 Allergy status to penicillin: Secondary | ICD-10-CM | POA: Diagnosis not present

## 2016-08-06 DIAGNOSIS — Z7982 Long term (current) use of aspirin: Secondary | ICD-10-CM | POA: Diagnosis not present

## 2016-08-06 DIAGNOSIS — K449 Diaphragmatic hernia without obstruction or gangrene: Secondary | ICD-10-CM | POA: Diagnosis not present

## 2016-08-06 DIAGNOSIS — R0602 Shortness of breath: Secondary | ICD-10-CM | POA: Diagnosis not present

## 2016-08-06 DIAGNOSIS — Z800 Family history of malignant neoplasm of digestive organs: Secondary | ICD-10-CM | POA: Diagnosis not present

## 2016-08-06 DIAGNOSIS — R42 Dizziness and giddiness: Secondary | ICD-10-CM | POA: Diagnosis not present

## 2016-08-06 DIAGNOSIS — C679 Malignant neoplasm of bladder, unspecified: Secondary | ICD-10-CM

## 2016-08-06 DIAGNOSIS — R310 Gross hematuria: Secondary | ICD-10-CM | POA: Diagnosis not present

## 2016-08-06 DIAGNOSIS — Z87891 Personal history of nicotine dependence: Secondary | ICD-10-CM | POA: Diagnosis not present

## 2016-08-06 DIAGNOSIS — J441 Chronic obstructive pulmonary disease with (acute) exacerbation: Secondary | ICD-10-CM | POA: Diagnosis not present

## 2016-08-06 DIAGNOSIS — Z79899 Other long term (current) drug therapy: Secondary | ICD-10-CM | POA: Diagnosis not present

## 2016-08-06 DIAGNOSIS — Z801 Family history of malignant neoplasm of trachea, bronchus and lung: Secondary | ICD-10-CM | POA: Diagnosis not present

## 2016-08-06 DIAGNOSIS — R531 Weakness: Secondary | ICD-10-CM | POA: Diagnosis not present

## 2016-08-06 DIAGNOSIS — J449 Chronic obstructive pulmonary disease, unspecified: Secondary | ICD-10-CM | POA: Diagnosis not present

## 2016-08-06 DIAGNOSIS — C678 Malignant neoplasm of overlapping sites of bladder: Secondary | ICD-10-CM | POA: Diagnosis not present

## 2016-08-06 DIAGNOSIS — I10 Essential (primary) hypertension: Secondary | ICD-10-CM | POA: Diagnosis not present

## 2016-08-06 DIAGNOSIS — M1990 Unspecified osteoarthritis, unspecified site: Secondary | ICD-10-CM | POA: Diagnosis not present

## 2016-08-06 DIAGNOSIS — I2510 Atherosclerotic heart disease of native coronary artery without angina pectoris: Secondary | ICD-10-CM | POA: Diagnosis not present

## 2016-08-06 DIAGNOSIS — Z888 Allergy status to other drugs, medicaments and biological substances status: Secondary | ICD-10-CM | POA: Diagnosis not present

## 2016-08-06 MED ORDER — OXYCODONE-ACETAMINOPHEN 5-325 MG PO TABS: 1.0000 | ORAL_TABLET | ORAL | 0 refills | Status: DC | PRN

## 2016-08-06 NOTE — Telephone Encounter (Signed)
Patients caregiver, Daiva Nakayama , called stating patient needs a refill on his pain medication. He wants to pick the prescription up for patient. It is time for a refill but Daiva Nakayama is not listed as a contact for patient. Spoke with Mr. Morataya via telephone and he verified it is okay for Butch to pick up prescription. Explained to Butch to wait until after lunch to pick up prescription to give me time to get it ready. He verbalized understanding.

## 2016-08-07 DIAGNOSIS — I2510 Atherosclerotic heart disease of native coronary artery without angina pectoris: Secondary | ICD-10-CM | POA: Diagnosis not present

## 2016-08-07 DIAGNOSIS — C679 Malignant neoplasm of bladder, unspecified: Secondary | ICD-10-CM | POA: Diagnosis not present

## 2016-08-07 DIAGNOSIS — J449 Chronic obstructive pulmonary disease, unspecified: Secondary | ICD-10-CM | POA: Diagnosis not present

## 2016-08-07 DIAGNOSIS — K449 Diaphragmatic hernia without obstruction or gangrene: Secondary | ICD-10-CM | POA: Diagnosis not present

## 2016-08-07 DIAGNOSIS — I10 Essential (primary) hypertension: Secondary | ICD-10-CM | POA: Diagnosis not present

## 2016-08-07 DIAGNOSIS — R310 Gross hematuria: Secondary | ICD-10-CM | POA: Diagnosis not present

## 2016-08-07 DIAGNOSIS — J441 Chronic obstructive pulmonary disease with (acute) exacerbation: Secondary | ICD-10-CM | POA: Diagnosis not present

## 2016-08-07 DIAGNOSIS — C678 Malignant neoplasm of overlapping sites of bladder: Secondary | ICD-10-CM | POA: Diagnosis not present

## 2016-08-12 ENCOUNTER — Ambulatory Visit (HOSPITAL_COMMUNITY): Payer: Medicare Other

## 2016-08-12 DIAGNOSIS — J440 Chronic obstructive pulmonary disease with acute lower respiratory infection: Secondary | ICD-10-CM | POA: Diagnosis not present

## 2016-08-12 DIAGNOSIS — R310 Gross hematuria: Secondary | ICD-10-CM | POA: Diagnosis not present

## 2016-08-12 DIAGNOSIS — I2510 Atherosclerotic heart disease of native coronary artery without angina pectoris: Secondary | ICD-10-CM | POA: Diagnosis not present

## 2016-09-03 ENCOUNTER — Other Ambulatory Visit (HOSPITAL_COMMUNITY): Payer: Self-pay

## 2016-09-03 DIAGNOSIS — C679 Malignant neoplasm of bladder, unspecified: Secondary | ICD-10-CM

## 2016-09-03 MED ORDER — OXYCODONE-ACETAMINOPHEN 5-325 MG PO TABS: 1.0000 | ORAL_TABLET | ORAL | 0 refills | Status: AC | PRN

## 2016-09-03 NOTE — Telephone Encounter (Signed)
Patient called for refill on pain medication. Reviewed with MD, chart checked and refilled.

## 2016-09-10 DIAGNOSIS — R42 Dizziness and giddiness: Secondary | ICD-10-CM | POA: Diagnosis not present

## 2016-09-10 DIAGNOSIS — C679 Malignant neoplasm of bladder, unspecified: Secondary | ICD-10-CM | POA: Diagnosis not present

## 2016-09-10 DIAGNOSIS — M1990 Unspecified osteoarthritis, unspecified site: Secondary | ICD-10-CM | POA: Diagnosis not present

## 2016-09-10 DIAGNOSIS — R310 Gross hematuria: Secondary | ICD-10-CM | POA: Diagnosis not present

## 2016-09-10 DIAGNOSIS — K2970 Gastritis, unspecified, without bleeding: Secondary | ICD-10-CM | POA: Diagnosis not present

## 2016-09-10 DIAGNOSIS — J449 Chronic obstructive pulmonary disease, unspecified: Secondary | ICD-10-CM | POA: Diagnosis not present

## 2016-09-10 DIAGNOSIS — Z87891 Personal history of nicotine dependence: Secondary | ICD-10-CM | POA: Diagnosis not present

## 2016-09-10 DIAGNOSIS — Z884 Allergy status to anesthetic agent status: Secondary | ICD-10-CM | POA: Diagnosis not present

## 2016-09-10 DIAGNOSIS — Z955 Presence of coronary angioplasty implant and graft: Secondary | ICD-10-CM | POA: Diagnosis not present

## 2016-09-10 DIAGNOSIS — R262 Difficulty in walking, not elsewhere classified: Secondary | ICD-10-CM | POA: Diagnosis not present

## 2016-09-10 DIAGNOSIS — Z8546 Personal history of malignant neoplasm of prostate: Secondary | ICD-10-CM | POA: Diagnosis not present

## 2016-09-10 DIAGNOSIS — R531 Weakness: Secondary | ICD-10-CM | POA: Diagnosis not present

## 2016-09-10 DIAGNOSIS — Z79899 Other long term (current) drug therapy: Secondary | ICD-10-CM | POA: Diagnosis not present

## 2016-09-10 DIAGNOSIS — Z880 Allergy status to penicillin: Secondary | ICD-10-CM | POA: Diagnosis not present

## 2016-09-10 DIAGNOSIS — I252 Old myocardial infarction: Secondary | ICD-10-CM | POA: Diagnosis not present

## 2016-09-10 DIAGNOSIS — K449 Diaphragmatic hernia without obstruction or gangrene: Secondary | ICD-10-CM | POA: Diagnosis not present

## 2016-09-10 DIAGNOSIS — C678 Malignant neoplasm of overlapping sites of bladder: Secondary | ICD-10-CM | POA: Diagnosis not present

## 2016-09-10 DIAGNOSIS — Z801 Family history of malignant neoplasm of trachea, bronchus and lung: Secondary | ICD-10-CM | POA: Diagnosis not present

## 2016-09-10 DIAGNOSIS — I2510 Atherosclerotic heart disease of native coronary artery without angina pectoris: Secondary | ICD-10-CM | POA: Diagnosis not present

## 2016-09-10 DIAGNOSIS — R51 Headache: Secondary | ICD-10-CM | POA: Diagnosis not present

## 2016-09-10 DIAGNOSIS — I10 Essential (primary) hypertension: Secondary | ICD-10-CM | POA: Diagnosis not present

## 2016-09-10 DIAGNOSIS — D649 Anemia, unspecified: Secondary | ICD-10-CM | POA: Diagnosis not present

## 2016-09-10 DIAGNOSIS — Z800 Family history of malignant neoplasm of digestive organs: Secondary | ICD-10-CM | POA: Diagnosis not present

## 2016-09-10 DIAGNOSIS — J441 Chronic obstructive pulmonary disease with (acute) exacerbation: Secondary | ICD-10-CM | POA: Diagnosis not present

## 2016-09-10 DIAGNOSIS — G4730 Sleep apnea, unspecified: Secondary | ICD-10-CM | POA: Diagnosis not present

## 2016-09-10 DIAGNOSIS — D630 Anemia in neoplastic disease: Secondary | ICD-10-CM | POA: Diagnosis not present

## 2016-09-11 DIAGNOSIS — D630 Anemia in neoplastic disease: Secondary | ICD-10-CM | POA: Diagnosis not present

## 2016-09-11 DIAGNOSIS — J441 Chronic obstructive pulmonary disease with (acute) exacerbation: Secondary | ICD-10-CM | POA: Diagnosis not present

## 2016-09-11 DIAGNOSIS — C678 Malignant neoplasm of overlapping sites of bladder: Secondary | ICD-10-CM | POA: Diagnosis not present

## 2016-09-11 DIAGNOSIS — I10 Essential (primary) hypertension: Secondary | ICD-10-CM | POA: Diagnosis not present

## 2016-09-11 DIAGNOSIS — R310 Gross hematuria: Secondary | ICD-10-CM | POA: Diagnosis not present

## 2016-09-13 DIAGNOSIS — I10 Essential (primary) hypertension: Secondary | ICD-10-CM | POA: Diagnosis not present

## 2016-09-13 DIAGNOSIS — J441 Chronic obstructive pulmonary disease with (acute) exacerbation: Secondary | ICD-10-CM | POA: Diagnosis not present

## 2016-09-13 DIAGNOSIS — R310 Gross hematuria: Secondary | ICD-10-CM | POA: Diagnosis not present

## 2016-09-13 DIAGNOSIS — D630 Anemia in neoplastic disease: Secondary | ICD-10-CM | POA: Diagnosis not present

## 2016-09-13 DIAGNOSIS — C678 Malignant neoplasm of overlapping sites of bladder: Secondary | ICD-10-CM | POA: Diagnosis not present

## 2016-09-16 ENCOUNTER — Other Ambulatory Visit (HOSPITAL_COMMUNITY): Payer: Medicare Other

## 2016-09-22 DIAGNOSIS — R310 Gross hematuria: Secondary | ICD-10-CM | POA: Diagnosis not present

## 2016-09-22 DIAGNOSIS — Z1389 Encounter for screening for other disorder: Secondary | ICD-10-CM | POA: Diagnosis not present

## 2016-09-22 DIAGNOSIS — Z0000 Encounter for general adult medical examination without abnormal findings: Secondary | ICD-10-CM | POA: Diagnosis not present

## 2016-09-22 DIAGNOSIS — J440 Chronic obstructive pulmonary disease with acute lower respiratory infection: Secondary | ICD-10-CM | POA: Diagnosis not present

## 2016-09-22 NOTE — Progress Notes (Deleted)
[name], [name]   CLINIC:  Medical Oncology  REASON FOR FOLLOW-UP:  Locally-advanced unresectable bladder cancer & iron deficiency anemia   PRIMARY CARE PROVIDER: Dr. Stoney Bang  [phone]  CURRENT THERAPY: Observation  HISTORY OF PRESENT ILLNESS:  (From original consult note on 06/15/16) Mr. Palecek is a 81 y.o. gentleman who presents with his friend (neighbor) for consultation for locally-advanced unresectable bladder cancer.  Of note, HPI info gathered from available outside records as well as the patient's recollection of his treatment history.    Mr. Armond tells me his initial diagnosis of bladder cancer dates back about 12 years ago.  He has undergone several TURBT procedures with Dr. Exie Parody, his urologist. He also had several fulguration procedures and mitomycin C treatments for his bladder cancer.   About 2 years ago, he reports his disease worsened with frequent bouts of frank hematuria.  He also reportedly has a history of early stage prostate cancer (Gleason 2 [1+1] disease per Dr. Johny Shears note) in 2006, which was found incidentally on TURP procedure and monitored with PSA tests alone per patient. He has many comorbid conditions, which have been updated in his medical record today.    More recently, he was having severe hematuria requiring intermittent PRBC transfusions. He was also struggling with urinary incontinence as well.  He recently completed palliative radiation therapy (completed 30 Gy in 3 fractions); completed radiation on 04/30/16. His hematuria has completed resolved.  He has also beginning to regain some control of his bladder at this time as well.    When asked if he has ever been told the extent or stage of his disease, he tells me, "I don't think anybody has ever told me any of that. I don't know if I have cancer anywhere else in my body or not."  He does report having intermittent right lower quadrant  abdominal/pelvic pain; this has been going on for several weeks.  He is concerned that his cancer has spread to this area.    It is also important to note, that Mr. 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    Mr. Simien tells me he is not interested in "too aggressive" treatment going forward.  He states, "I think my cancer might be pretty far down the line and I don't any anything too aggressive.  I just don't know what my options are, but I know I don't want to feel bad."    He and his friend go on to tell me that he has a DNR at his home and has always been a DNR when he goes into the hospital.    INTERVAL HISTORY:  Mr. Meiklejohn presents today with his neighbor/friend for continued follow-up.  He had a restaging PET scan on 07/23/16 and is here to talk about those results and develop a plan of care.   He tells me his hematuria returned about 2 weeks ago. He he has noted bleeding during his whole stream but states "I can't tell for sure." He endorses some burning and urinary frequency, about 10-12 times/day. Denies passing any blood clots. His urine is red and clear per his report. He does have intermittent bladder pain, for which he takes Percocet about once per day, usually only at bedtime. The friend/neighbor who is with him today, tells me that he thinks that Mr. Schulenburg needs a refill on  the Percocet.  He continues to have weakness and fatigue. His appetite is okay by his report. He does not sleep well, which has been an issue for the past 2 weeks. He does report taking several naps throughout the day.    REVIEW OF SYSTEMS: Review of Systems  Constitutional: Positive for malaise/fatigue.  HENT: Negative.   Eyes: Negative.   Respiratory: Positive for shortness of breath (h/o COPD; oxygen dependent).   Cardiovascular: Positive for leg swelling. Negative for chest pain.  Gastrointestinal: Positive  for abdominal pain (RLQ abdominal/pelvic pain ).  Genitourinary: Negative for hematuria.  Skin: Negative.   Neurological: Negative.   Endo/Heme/Allergies: Negative.      PAST MEDICAL & SURGICAL HISTORY:  Past Medical History:  Diagnosis Date  . Anemia   . Arthritis   . Asthma   . Bladder cancer ([place])   . Cerebral meningioma ([place])   . COPD (chronic obstructive pulmonary disease) ([place])   . Coronary atherosclerosis of native coronary artery    DES to OM October 2014  . Essential hypertension, benign   . GERD (gastroesophageal reflux disease)   . Hiatal hernia   . History of asbestosis   . History of pneumonia   . Hypertension   . Mixed hyperlipidemia   . Nephrolithiasis   . Prostate cancer ([place])   . Sleep apnea    Unable to tolerate CPAP    Past Surgical History:  Procedure Laterality Date  . CATARACT EXTRACTION W/ INTRAOCULAR LENS  IMPLANT, BILATERAL Bilateral 2013  . CRANIOTOMY FOR STEREOTACTIC GUIDED SURGERY  07/2010   Meningioma resection  . KIDNEY STONE SURGERY  2012   "opened me up" (04/06/2013)  . KNEE SURGERY Right   . LEFT HEART CATHETERIZATION WITH CORONARY ANGIOGRAM N/A 04/06/2013   Procedure: LEFT HEART CATHETERIZATION WITH CORONARY ANGIOGRAM;  Surgeon: Blane Ohara, MD;  Location[facility] C[facility] LAB;  Service: Cardiovascular;  Laterality: N/A;  . PERCUTANEOUS CORONARY STENT INTERVENTION (PCI-S)  04/06/2013   Procedure: PERCUTANEOUS CORONARY STENT INTERVENTION (PCI-S);  Surgeon: Blane Ohara, MD;  Location: [facility] CATH LAB;  Service: Cardiovascular;;  . PROSTATECTOMY  2010     ONCOLOGIC FAMILY HISTORY:  -Mother: Died with colon cancer -Father: Leukemia, lymphoma, and skin cancer -Twin brother: Lung cancer -Sister: Died with breast cancer    SOCIAL HISTORY:  Mr. Buch is widowed and lives alone; his wife died from pancreatic cancer is 2012. They were married for 25 years.  He was born and raised in [place]. [place] and loved riding horses.  His  neighbor helps him, when needed.  He has 3 sons and 2 stepchildren.  He is retired; he previously worked as the Archivist for [employer]; he also served in [employer]. He denies any current tobacco use; he previously smoked 4 ppd x ~35 years. No alcohol or illicit drug use.     CURRENT MEDICATIONS:  Current Outpatient Prescriptions on File Prior to Visit  Medication Sig Dispense Refill  . albuterol (PROVENTIL HFA;VENTOLIN HFA) 108 (90 BASE) MCG/ACT inhaler Inhale 2 puffs into the lungs every 6 (six) hours as needed for wheezing. 1 Inhaler 2  . amLODipine (NORVASC) 5 MG tablet Take 1 tablet (5 mg total) by mouth daily. 90 tablet 3  . aspirin EC 81 MG EC tablet Take 1 tablet (81 mg total) by mouth daily. 30 tablet 0  . Diclofenac-Misoprostol (ARTHROTEC) 75-0.2 MG TBEC Take 1 tablet by mouth 2 (two) times daily.    Marland Kitchen  fish oil-omega-3 fatty acids 1000 MG capsule Take 1 g by mouth daily.     . formoterol (FORADIL) 12 MCG capsule for inhaler Place 12 mcg into inhaler and inhale 2 (two) times daily.    Marland Kitchen gabapentin (NEURONTIN) 300 MG capsule Take 300 mg by mouth 3 (three) times daily.    . isosorbide mononitrate (IMDUR) 30 MG 24 hr tablet Take 1 tablet (30 mg total) by mouth 2 (two) times daily. 180 tablet 3  . LORazepam (ATIVAN) 0.5 MG tablet Take 0.5 mg by mouth daily.     . metoprolol (LOPRESSOR) 50 MG tablet Take 1 tablet (50 mg total) by mouth 2 (two) times daily. 60 tablet 6  . nitroGLYCERIN (NITROSTAT) 0.4 MG SL tablet Place 1 tablet (0.4 mg total) under the tongue every 5 (five) minutes as needed for chest pain. 25 tablet 3  . omeprazole (PRILOSEC) 40 MG capsule Take 40 mg by mouth daily.    Marland Kitchen oxazepam (SERAX) 10 MG capsule Take 1 capsule by mouth at bedtime.    Marland Kitchen oxybutynin (DITROPAN-XL) 10 MG 24 hr tablet     . oxyCODONE-acetaminophen (PERCOCET/ROXICET) 5-325 MG tablet Take 1 tablet by mouth every 4 (four) hours as needed for severe pain. 60 tablet 0  . pravastatin (PRAVACHOL) 40  MG tablet TAKE 1 TABLET EVERY EVENING 90 tablet 3  . SEREVENT DISKUS 50 MCG/DOSE diskus inhaler Inhale 1 puff into the lungs 2 (two) times daily.    Marland Kitchen tiotropium (SPIRIVA) 18 MCG inhalation capsule Place 18 mcg into inhaler and inhale daily.    . traMADol (ULTRAM) 50 MG tablet      No current facility-administered medications on file prior to visit.      ALLERGIES: Allergies  Allergen Reactions  . Lidocaine   . Penicillins      PERFORMANCE STATUS: 60   PHYSICAL EXAM:  There were no vitals filed for this visit. There were no vitals filed for this visit.  General: Pale male in no acute distress.  Accompanied by friend/neighbor.   HEENT: Head is normocephalic.  Pupils equal and reactive to light. Conjunctivae clear without exudate.  Sclerae anicteric. Oral mucosa is pink and moist without lesions. Oropharynx is pink and moist without lesions. Lymph: No cervical, supraclavicular, or infraclavicular lymphadenopathy noted on palpation.   Cardiovascular: Normal rate and rhythm Respiratory: Mild expiratory wheezes bilaterally. Chest expansion symmetric without accessory muscle use. Breathing non-labored. Barrel chest noted.    GU: Deferred.   GI: Soft, non-tender abdomen. Normoactive bowel sounds.  Neuro: No focal deficits. Gait not assessed; patient seen seated in wheelchair.    Psych: Normal mood and affect for situation.   Extremities: Trace LE edema.  Skin: Warm and dry.    LABORATORY DATA:    DIAGNOSTIC IMAGING:  PET scan: 07/23/16    ASSESSMENT & PLAN:  Mr. Vera is a pleasant 81 y.o. male with history of reportedly locally advanced unresectable bladder cancer treated with TURBT, mitomycin C, and palliative XRT; completed radiation on 04/30/16. He presents to cancer center today for follow-up with medical oncology and to discuss further plan of care for his bladder cancer.     Unresectable locally advanced bladder cancer:  -His most recent PET scan on 07/23/16 was  negative for metastatic disease. He does have small indeterminate pulmonary nodules, which are likely benign per radiologist's report; follow-up with CT chest in 3-4 months was recommended. I discussed these recommendations with the patient. I shared that these nodules are unlikely to be metastatic  disease, but we will follow them clinically and with serial imaging as appropriate. Regarding future plan of care for his locally advanced bladder cancer, there is likely no systemic therapy available to him at this time. Certainly if he develops metastatic disease then immunotherapy or chemotherapy could be potential options. However, given his overall marginal performance status, chemotherapy would likely not be our first choice. With regards to continued management, I discussed his case with Dr. Whitney Muse. She recommended that I speak with Dr. Alice Rieger in [place], who is the GU expert there. I discussed Mr. Fadeley case with Dr. Alen Blew; he recommends referral back to urology for consideration of surgical interventions possibly cystectomy versus repeat fulguration. Whether his bladder cancer is superficial versus obvious muscle invasion will likely impact the surgical and urologic procedure options for Mr. Haskew.   Our hope is that continued interventions with urology will optimize his symptoms of hematuria and bladder pain. Mr. Kunin has been clear that his goals of care are palliation of his symptoms; he does not desire treatments that may be too invasive or diminish his quality of life.  Therefore, from a medical oncology standpoint, we will continue to monitor and may offer systemic therapy if he develops metastatic disease. We will also continue to manage his anemia likely secondary to chronic blood loss. Conversations with Dr. Alen Blew occurred after the patient left clinic today; I will give him a call and discuss our recommendations for him going forward which include referral back to urology for  consideration of future treatment. He will return to the cancer center in 2 months for continued follow-up.  Anemia:  -His hemoglobin is slightly improved today, despite recent report of 2 weeks of hematuria. Hemoglobin 12.1 today. He is slightly symptomatic with continued fatigue and weakness. His ferritin and iron studies were not available for review while the patient was on the clinic;  result available now showing ferritin at 61 ng/dL. His calculated iron deficit is 446. Therefore, given his continued hematuria, we will make arrangements for the patient to come in for one dose of IV injector for 750 mg, for a target hemoglobin of 14 g/dL. We will bring him back in approximately 8 weeks to reassess his iron deficiency anemia post-IV iron infusion.   Pelvic pain:  -Maintain current pain treatment regimen with Percocet 5/325 mg as needed. Upon review of the patient's most recent prescriptions, he received a prescription from Dr. Whitney Muse on 07/09/16 and receives 60 tablets. Since the patient is only requiring once daily dosing of Percocet, he should have plenty of medication left at this time. Encouraged him to call our office when he does need a refill or if his pain worsens and he requires increased pain management.  Goals of care:  -We reviewed his goals of care which include palliation of his symptoms and improving quality of life.  He does not desire treatments with significant symptom burden.  He remains a DNR and his friends/family are aware of his wishes.    Dispo:  -Return to cancer center in 2 months for continued follow-up with CBC with diff & ferritin.       A total of 30 minutes was spent in face-to-face care of this patient, with greater than 50% of that time spent in counseling and care coordination.    Mike Craze, NP [place] [phone]

## 2016-09-23 ENCOUNTER — Ambulatory Visit (HOSPITAL_COMMUNITY): Payer: Medicare Other | Admitting: Adult Health

## 2016-09-28 DIAGNOSIS — D649 Anemia, unspecified: Secondary | ICD-10-CM | POA: Diagnosis not present

## 2016-09-29 DIAGNOSIS — D649 Anemia, unspecified: Secondary | ICD-10-CM | POA: Diagnosis not present

## 2016-10-06 DIAGNOSIS — D649 Anemia, unspecified: Secondary | ICD-10-CM | POA: Diagnosis not present

## 2016-10-06 DIAGNOSIS — C678 Malignant neoplasm of overlapping sites of bladder: Secondary | ICD-10-CM | POA: Diagnosis not present

## 2016-10-06 DIAGNOSIS — Z880 Allergy status to penicillin: Secondary | ICD-10-CM | POA: Diagnosis not present

## 2016-10-06 DIAGNOSIS — J449 Chronic obstructive pulmonary disease, unspecified: Secondary | ICD-10-CM | POA: Diagnosis present

## 2016-10-06 DIAGNOSIS — G4730 Sleep apnea, unspecified: Secondary | ICD-10-CM | POA: Diagnosis present

## 2016-10-06 DIAGNOSIS — I10 Essential (primary) hypertension: Secondary | ICD-10-CM | POA: Diagnosis not present

## 2016-10-06 DIAGNOSIS — J441 Chronic obstructive pulmonary disease with (acute) exacerbation: Secondary | ICD-10-CM | POA: Diagnosis not present

## 2016-10-06 DIAGNOSIS — Z87891 Personal history of nicotine dependence: Secondary | ICD-10-CM | POA: Diagnosis not present

## 2016-10-06 DIAGNOSIS — Z8546 Personal history of malignant neoplasm of prostate: Secondary | ICD-10-CM | POA: Diagnosis not present

## 2016-10-06 DIAGNOSIS — I252 Old myocardial infarction: Secondary | ICD-10-CM | POA: Diagnosis not present

## 2016-10-06 DIAGNOSIS — Z888 Allergy status to other drugs, medicaments and biological substances status: Secondary | ICD-10-CM | POA: Diagnosis not present

## 2016-10-06 DIAGNOSIS — D62 Acute posthemorrhagic anemia: Secondary | ICD-10-CM | POA: Diagnosis not present

## 2016-10-06 DIAGNOSIS — Z79891 Long term (current) use of opiate analgesic: Secondary | ICD-10-CM | POA: Diagnosis not present

## 2016-10-06 DIAGNOSIS — R319 Hematuria, unspecified: Secondary | ICD-10-CM | POA: Diagnosis not present

## 2016-10-06 DIAGNOSIS — Z800 Family history of malignant neoplasm of digestive organs: Secondary | ICD-10-CM | POA: Diagnosis not present

## 2016-10-06 DIAGNOSIS — R12 Heartburn: Secondary | ICD-10-CM | POA: Diagnosis not present

## 2016-10-06 DIAGNOSIS — R42 Dizziness and giddiness: Secondary | ICD-10-CM | POA: Diagnosis not present

## 2016-10-06 DIAGNOSIS — Z955 Presence of coronary angioplasty implant and graft: Secondary | ICD-10-CM | POA: Diagnosis not present

## 2016-10-06 DIAGNOSIS — Z9981 Dependence on supplemental oxygen: Secondary | ICD-10-CM | POA: Diagnosis not present

## 2016-10-06 DIAGNOSIS — D630 Anemia in neoplastic disease: Secondary | ICD-10-CM | POA: Diagnosis not present

## 2016-10-06 DIAGNOSIS — Z801 Family history of malignant neoplasm of trachea, bronchus and lung: Secondary | ICD-10-CM | POA: Diagnosis not present

## 2016-10-06 DIAGNOSIS — I2510 Atherosclerotic heart disease of native coronary artery without angina pectoris: Secondary | ICD-10-CM | POA: Diagnosis present

## 2016-10-06 DIAGNOSIS — R310 Gross hematuria: Secondary | ICD-10-CM | POA: Diagnosis not present

## 2016-10-06 DIAGNOSIS — C679 Malignant neoplasm of bladder, unspecified: Secondary | ICD-10-CM | POA: Diagnosis not present

## 2016-10-06 DIAGNOSIS — R404 Transient alteration of awareness: Secondary | ICD-10-CM | POA: Diagnosis not present

## 2016-10-06 DIAGNOSIS — M1990 Unspecified osteoarthritis, unspecified site: Secondary | ICD-10-CM | POA: Diagnosis present

## 2016-10-06 DIAGNOSIS — K449 Diaphragmatic hernia without obstruction or gangrene: Secondary | ICD-10-CM | POA: Diagnosis present

## 2016-10-06 DIAGNOSIS — Z79899 Other long term (current) drug therapy: Secondary | ICD-10-CM | POA: Diagnosis not present

## 2016-10-15 DIAGNOSIS — R402421 Glasgow coma scale score 9-12, in the field [EMT or ambulance]: Secondary | ICD-10-CM | POA: Diagnosis not present

## 2016-10-15 DIAGNOSIS — R319 Hematuria, unspecified: Secondary | ICD-10-CM | POA: Diagnosis not present

## 2016-10-15 DIAGNOSIS — D649 Anemia, unspecified: Secondary | ICD-10-CM | POA: Diagnosis not present

## 2016-10-15 DIAGNOSIS — E860 Dehydration: Secondary | ICD-10-CM | POA: Diagnosis not present

## 2016-10-15 DIAGNOSIS — S299XXA Unspecified injury of thorax, initial encounter: Secondary | ICD-10-CM | POA: Diagnosis not present

## 2016-10-16 DIAGNOSIS — R591 Generalized enlarged lymph nodes: Secondary | ICD-10-CM | POA: Diagnosis not present

## 2016-10-16 DIAGNOSIS — D630 Anemia in neoplastic disease: Secondary | ICD-10-CM | POA: Diagnosis not present

## 2016-10-16 DIAGNOSIS — C678 Malignant neoplasm of overlapping sites of bladder: Secondary | ICD-10-CM | POA: Diagnosis not present

## 2016-10-16 DIAGNOSIS — S299XXA Unspecified injury of thorax, initial encounter: Secondary | ICD-10-CM | POA: Diagnosis not present

## 2016-10-16 DIAGNOSIS — Z6841 Body Mass Index (BMI) 40.0 and over, adult: Secondary | ICD-10-CM | POA: Diagnosis not present

## 2016-10-16 DIAGNOSIS — D62 Acute posthemorrhagic anemia: Secondary | ICD-10-CM | POA: Diagnosis not present

## 2016-10-17 DIAGNOSIS — C678 Malignant neoplasm of overlapping sites of bladder: Secondary | ICD-10-CM | POA: Diagnosis not present

## 2016-10-17 DIAGNOSIS — D62 Acute posthemorrhagic anemia: Secondary | ICD-10-CM | POA: Diagnosis not present

## 2016-10-17 DIAGNOSIS — Z6841 Body Mass Index (BMI) 40.0 and over, adult: Secondary | ICD-10-CM | POA: Diagnosis not present

## 2016-10-17 DIAGNOSIS — D630 Anemia in neoplastic disease: Secondary | ICD-10-CM | POA: Diagnosis not present

## 2016-10-17 DIAGNOSIS — R591 Generalized enlarged lymph nodes: Secondary | ICD-10-CM | POA: Diagnosis not present

## 2016-10-18 DIAGNOSIS — Z6841 Body Mass Index (BMI) 40.0 and over, adult: Secondary | ICD-10-CM | POA: Diagnosis not present

## 2016-10-18 DIAGNOSIS — R591 Generalized enlarged lymph nodes: Secondary | ICD-10-CM | POA: Diagnosis not present

## 2016-10-18 DIAGNOSIS — C678 Malignant neoplasm of overlapping sites of bladder: Secondary | ICD-10-CM | POA: Diagnosis not present

## 2016-10-18 DIAGNOSIS — D630 Anemia in neoplastic disease: Secondary | ICD-10-CM | POA: Diagnosis not present

## 2016-10-18 DIAGNOSIS — D62 Acute posthemorrhagic anemia: Secondary | ICD-10-CM | POA: Diagnosis not present

## 2016-10-26 DEATH — deceased

## 2016-11-06 ENCOUNTER — Other Ambulatory Visit: Payer: Self-pay | Admitting: Nurse Practitioner
